# Patient Record
Sex: Female | Born: 1955 | Race: White | Hispanic: No | Smoking: Current every day smoker
Health system: Southern US, Community
[De-identification: ages and names within clinical notes are randomized; demographics above are authoritative.]

## PROBLEM LIST (undated history)

## (undated) DIAGNOSIS — E559 Vitamin D deficiency, unspecified: Secondary | ICD-10-CM

## (undated) DIAGNOSIS — T7840XA Allergy, unspecified, initial encounter: Secondary | ICD-10-CM

## (undated) DIAGNOSIS — M722 Plantar fascial fibromatosis: Secondary | ICD-10-CM

## (undated) DIAGNOSIS — D219 Benign neoplasm of connective and other soft tissue, unspecified: Secondary | ICD-10-CM

## (undated) DIAGNOSIS — E785 Hyperlipidemia, unspecified: Principal | ICD-10-CM

## (undated) DIAGNOSIS — IMO0002 Reserved for concepts with insufficient information to code with codable children: Secondary | ICD-10-CM

## (undated) HISTORY — DX: Allergy, unspecified, initial encounter: T78.40XA

## (undated) HISTORY — DX: Benign neoplasm of connective and other soft tissue, unspecified: D21.9

## (undated) HISTORY — PX: SPINE SURGERY: SHX786

## (undated) HISTORY — PX: FOOT SURGERY: SHX648

## (undated) HISTORY — PX: TUBAL LIGATION: SHX77

## (undated) HISTORY — DX: Hyperlipidemia, unspecified: E78.5

## (undated) HISTORY — PX: BACK SURGERY: SHX140

## (undated) HISTORY — DX: Plantar fascial fibromatosis: M72.2

## (undated) HISTORY — DX: Reserved for concepts with insufficient information to code with codable children: IMO0002

## (undated) HISTORY — PX: DILATION AND CURETTAGE OF UTERUS: SHX78

## (undated) HISTORY — DX: Vitamin D deficiency, unspecified: E55.9

## (undated) HISTORY — PX: NECK SURGERY: SHX720

---

## 2001-02-09 ENCOUNTER — Encounter: Payer: Self-pay | Admitting: Specialist

## 2001-02-09 ENCOUNTER — Ambulatory Visit (HOSPITAL_COMMUNITY): Admission: RE | Admit: 2001-02-09 | Discharge: 2001-02-09 | Payer: Self-pay | Admitting: Specialist

## 2001-05-17 ENCOUNTER — Other Ambulatory Visit: Admission: RE | Admit: 2001-05-17 | Discharge: 2001-05-17 | Payer: Self-pay | Admitting: Dermatology

## 2003-08-05 ENCOUNTER — Encounter (INDEPENDENT_AMBULATORY_CARE_PROVIDER_SITE_OTHER): Payer: Self-pay | Admitting: Internal Medicine

## 2005-06-06 HISTORY — PX: LEEP: SHX91

## 2006-03-13 ENCOUNTER — Ambulatory Visit (HOSPITAL_COMMUNITY): Admission: RE | Admit: 2006-03-13 | Discharge: 2006-03-13 | Payer: Self-pay | Admitting: Obstetrics and Gynecology

## 2006-12-13 ENCOUNTER — Telehealth (INDEPENDENT_AMBULATORY_CARE_PROVIDER_SITE_OTHER): Payer: Self-pay | Admitting: *Deleted

## 2006-12-13 ENCOUNTER — Ambulatory Visit (HOSPITAL_COMMUNITY): Admission: RE | Admit: 2006-12-13 | Discharge: 2006-12-13 | Payer: Self-pay | Admitting: Internal Medicine

## 2006-12-13 ENCOUNTER — Ambulatory Visit: Payer: Self-pay | Admitting: Internal Medicine

## 2006-12-13 DIAGNOSIS — M129 Arthropathy, unspecified: Secondary | ICD-10-CM | POA: Insufficient documentation

## 2006-12-13 DIAGNOSIS — E785 Hyperlipidemia, unspecified: Secondary | ICD-10-CM | POA: Insufficient documentation

## 2006-12-13 DIAGNOSIS — K279 Peptic ulcer, site unspecified, unspecified as acute or chronic, without hemorrhage or perforation: Secondary | ICD-10-CM | POA: Insufficient documentation

## 2006-12-13 DIAGNOSIS — M79609 Pain in unspecified limb: Secondary | ICD-10-CM | POA: Insufficient documentation

## 2006-12-13 DIAGNOSIS — H269 Unspecified cataract: Secondary | ICD-10-CM | POA: Insufficient documentation

## 2006-12-13 DIAGNOSIS — F411 Generalized anxiety disorder: Secondary | ICD-10-CM

## 2006-12-13 DIAGNOSIS — J309 Allergic rhinitis, unspecified: Secondary | ICD-10-CM | POA: Insufficient documentation

## 2006-12-13 DIAGNOSIS — M545 Low back pain, unspecified: Secondary | ICD-10-CM | POA: Insufficient documentation

## 2006-12-14 ENCOUNTER — Encounter (INDEPENDENT_AMBULATORY_CARE_PROVIDER_SITE_OTHER): Payer: Self-pay | Admitting: Internal Medicine

## 2006-12-15 ENCOUNTER — Telehealth (INDEPENDENT_AMBULATORY_CARE_PROVIDER_SITE_OTHER): Payer: Self-pay | Admitting: *Deleted

## 2006-12-15 ENCOUNTER — Encounter (INDEPENDENT_AMBULATORY_CARE_PROVIDER_SITE_OTHER): Payer: Self-pay | Admitting: *Deleted

## 2007-05-28 ENCOUNTER — Other Ambulatory Visit: Admission: RE | Admit: 2007-05-28 | Discharge: 2007-05-28 | Payer: Self-pay | Admitting: Obstetrics and Gynecology

## 2008-03-07 IMAGING — CR DG FOOT COMPLETE 3+V*R*
3 series · 3 of 3 positions shown · non-contrast
Comparison: none

CLINICAL DATA: Foot pain bilaterally. No injury.
 LEFT FOOT ? 3 VIEW:

[view not recorded (1 of 3)]
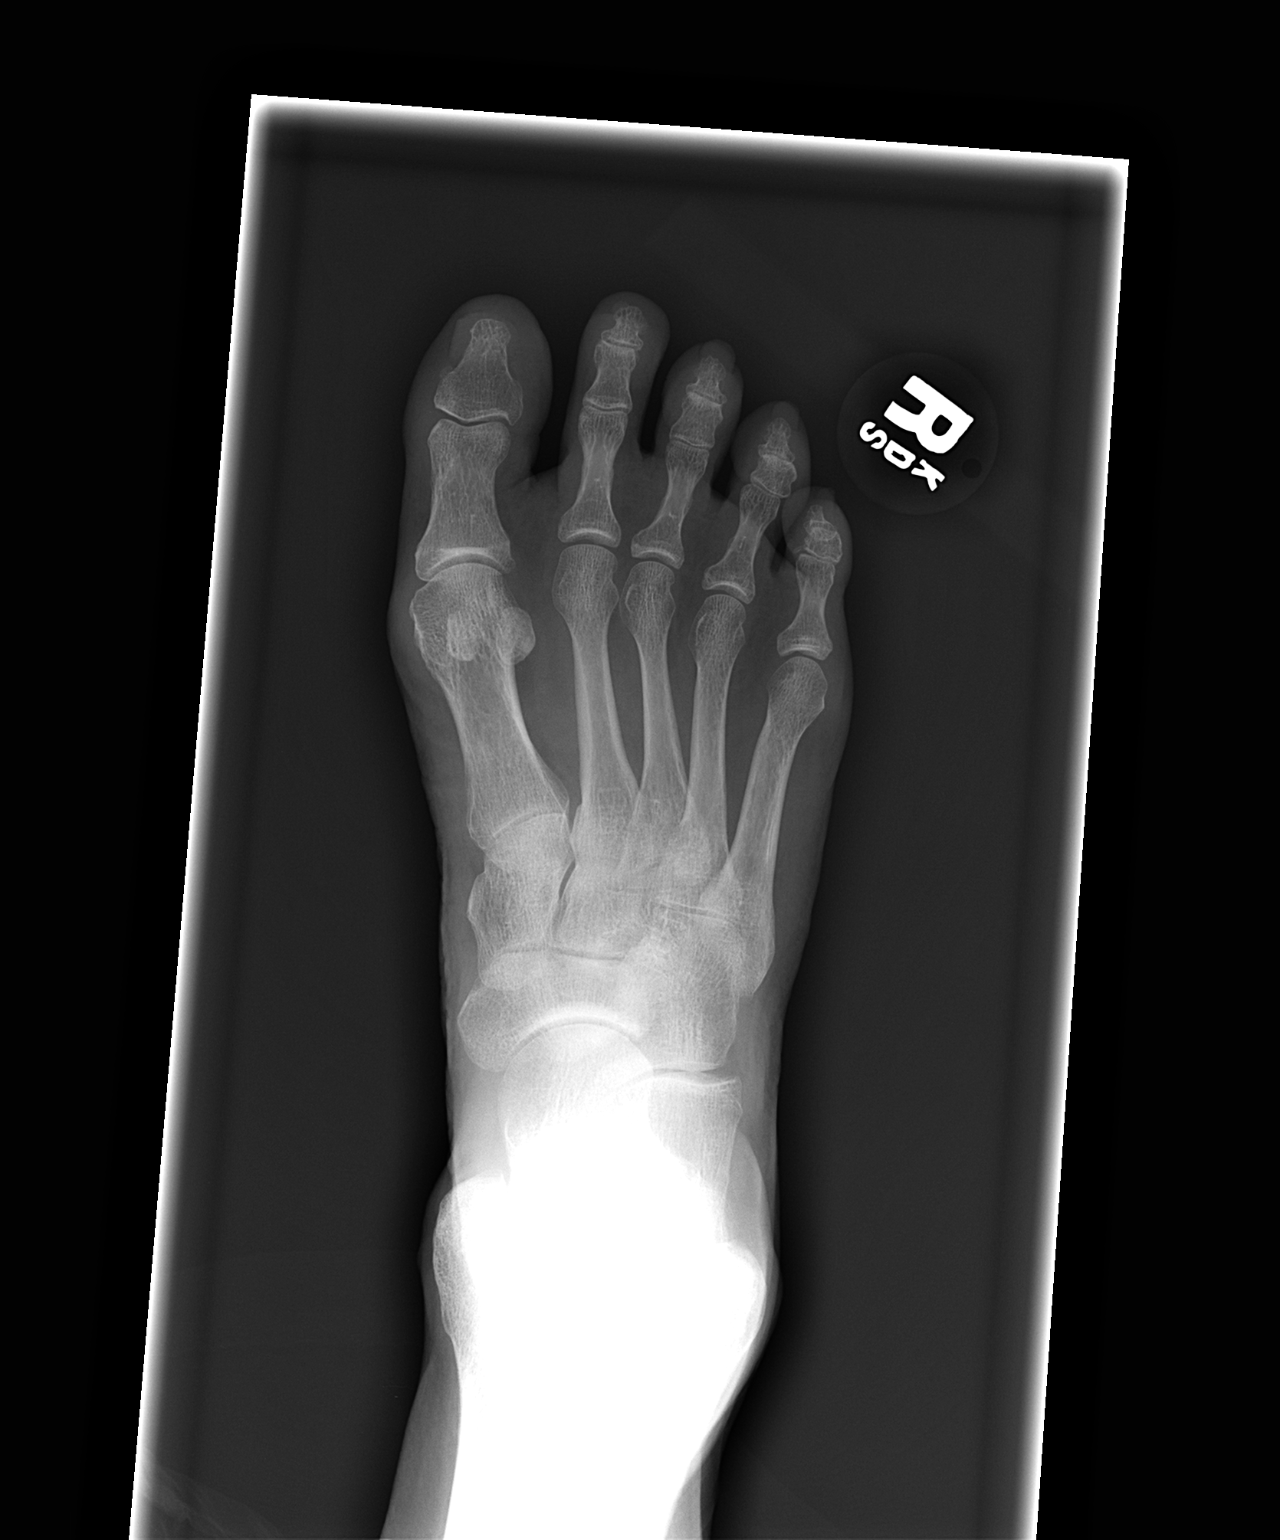

[view not recorded (2 of 3)]
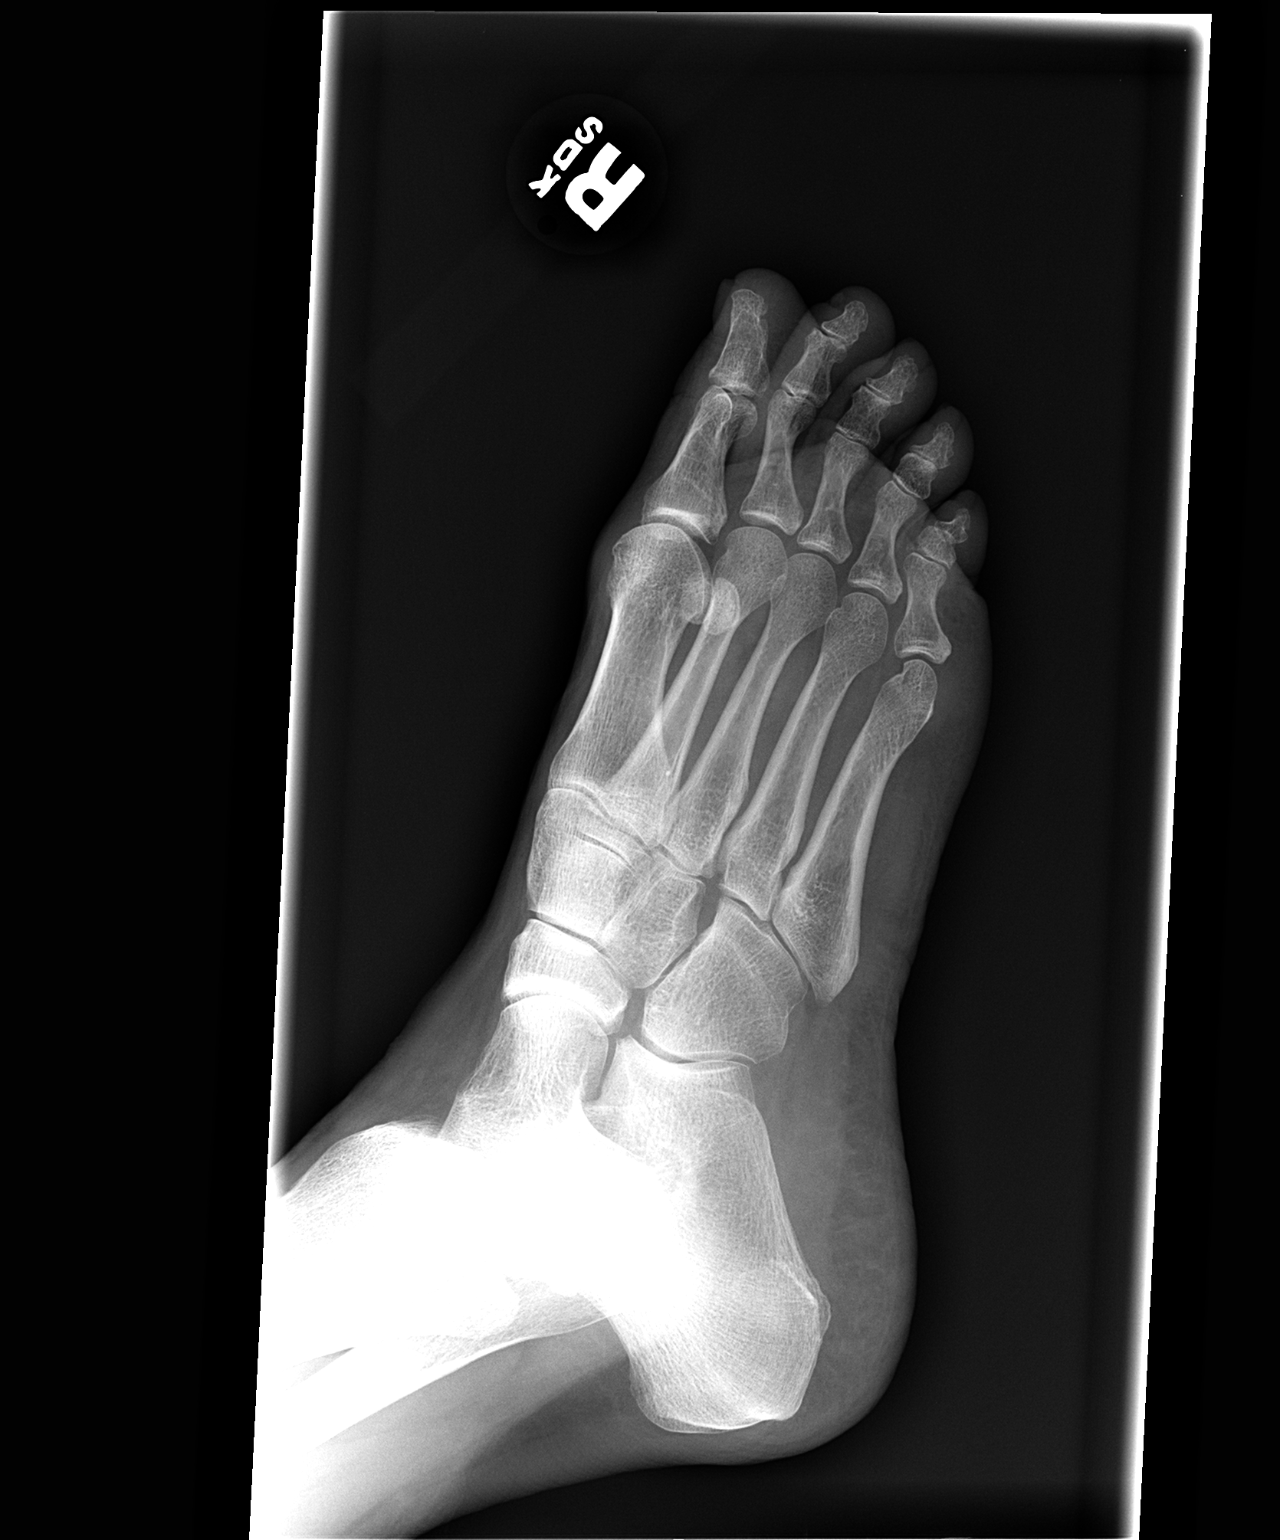

[view not recorded (3 of 3)]
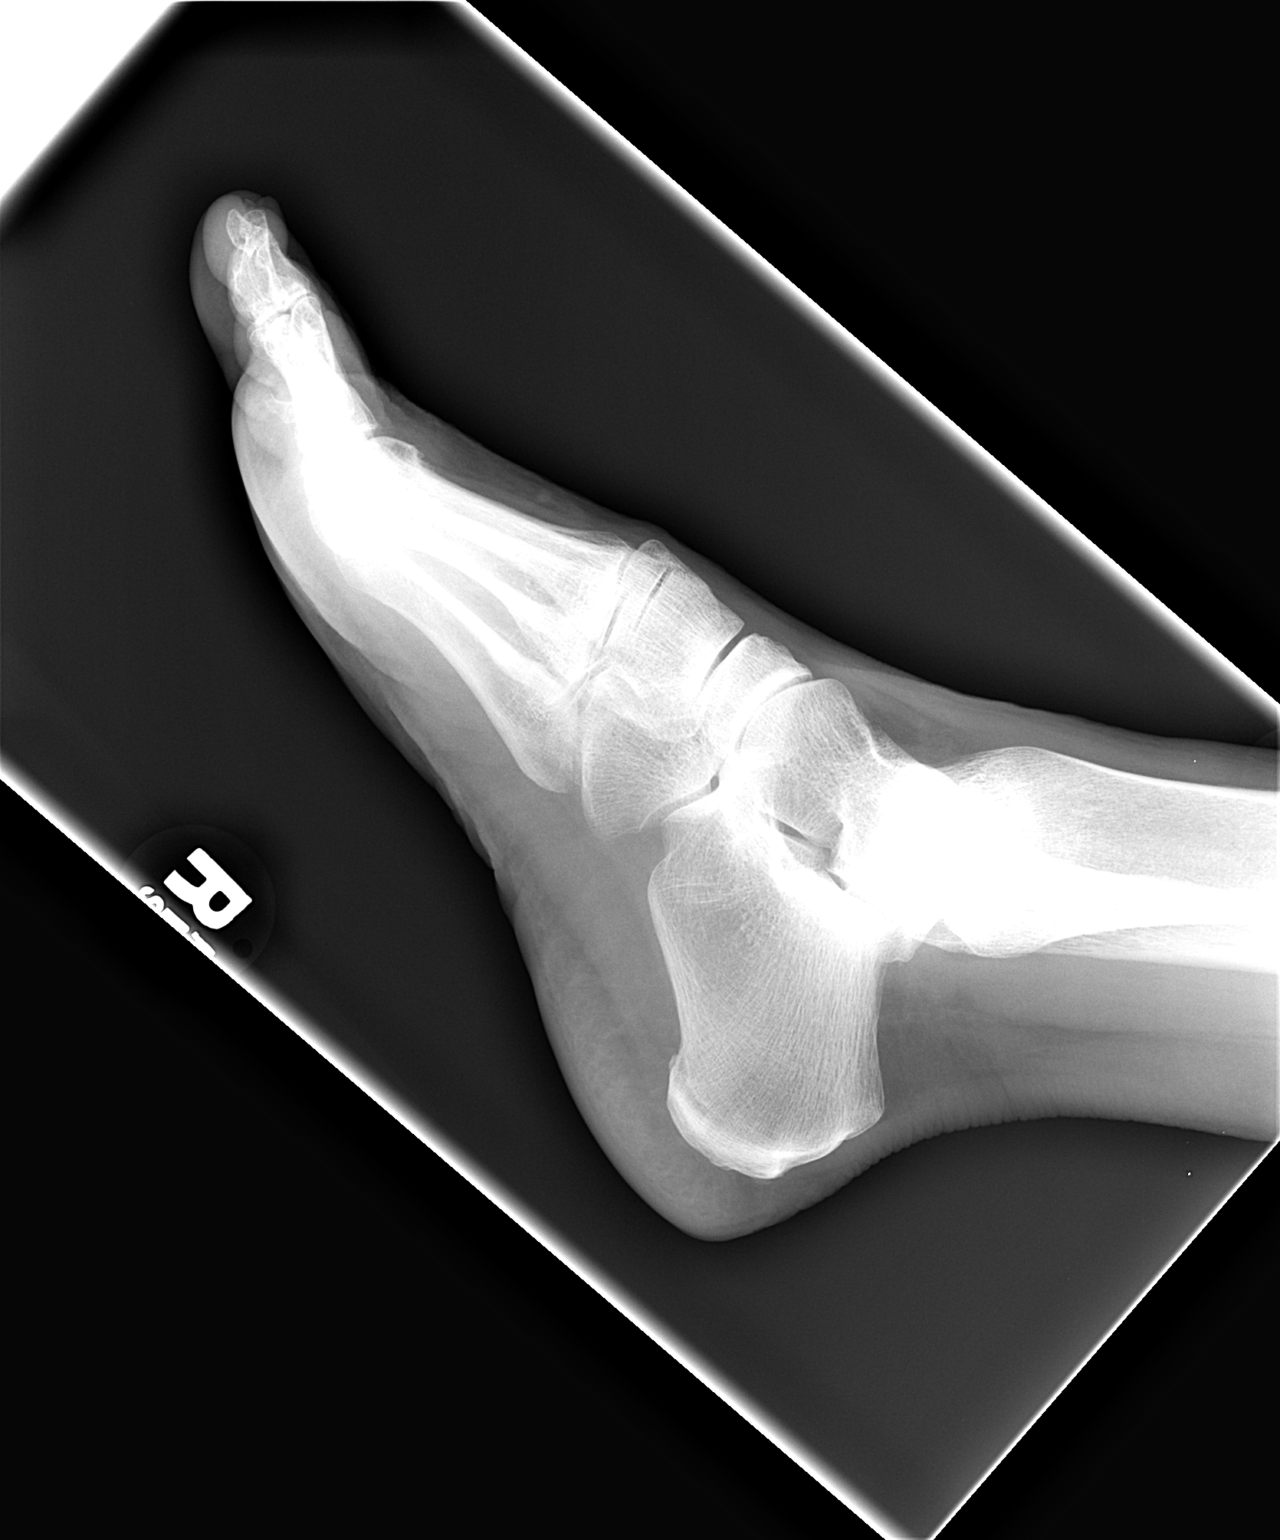

[3 of 3 positions shown; findings below may reference images not displayed]

FINDINGS: Three views of the left foot were obtained.  No acute bony abnormality is seen.  MTP, PIP and DIP joints appear normal with no erosions.  Alignment is normal.
IMPRESSION: Negative left foot.
 RIGHT FOOT ? 3 VIEW:
FINDINGS: Three views of the right foot show no acute abnormality.  Alignment is normal, and no erosions are seen.
IMPRESSION: Negative right foot.

## 2008-03-07 IMAGING — CR DG FOOT COMPLETE 3+V*L*
3 series · 3 of 3 positions shown · non-contrast
Comparison: none

CLINICAL DATA: Foot pain bilaterally. No injury.
 LEFT FOOT ? 3 VIEW:

[view not recorded (1 of 3)]
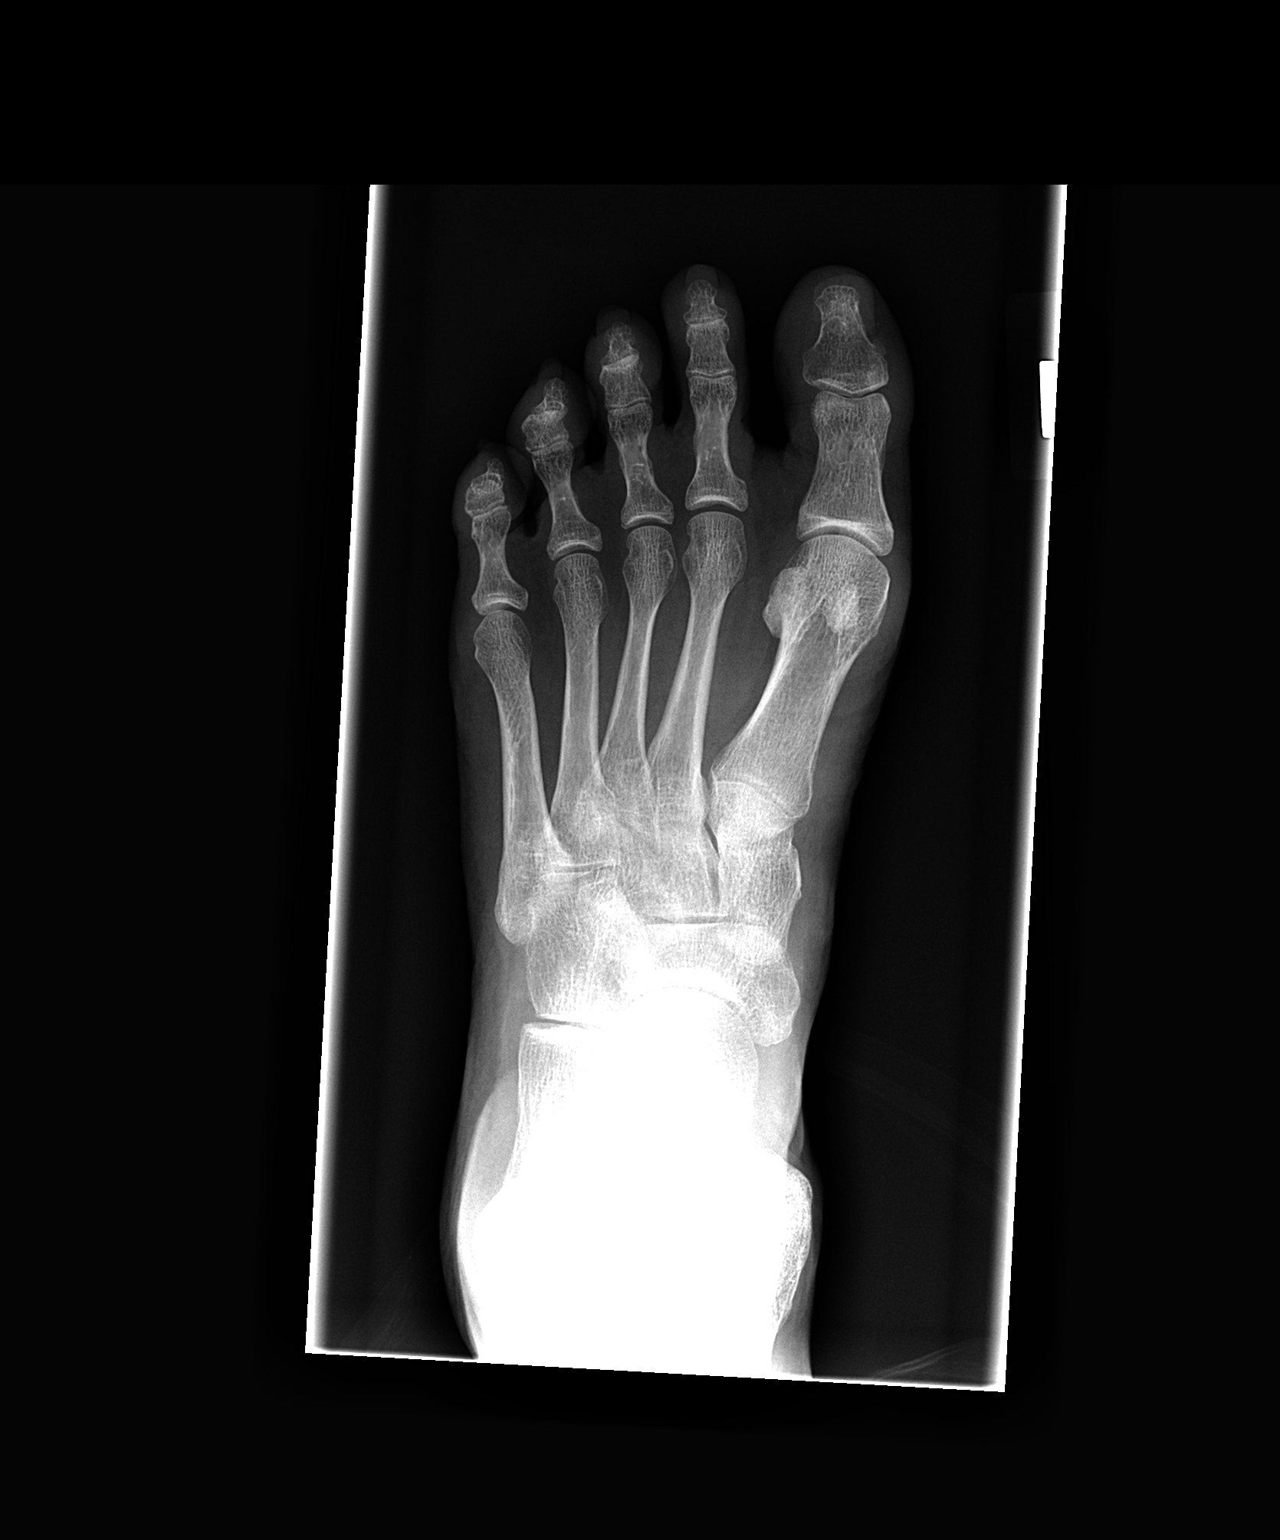

[view not recorded (2 of 3)]
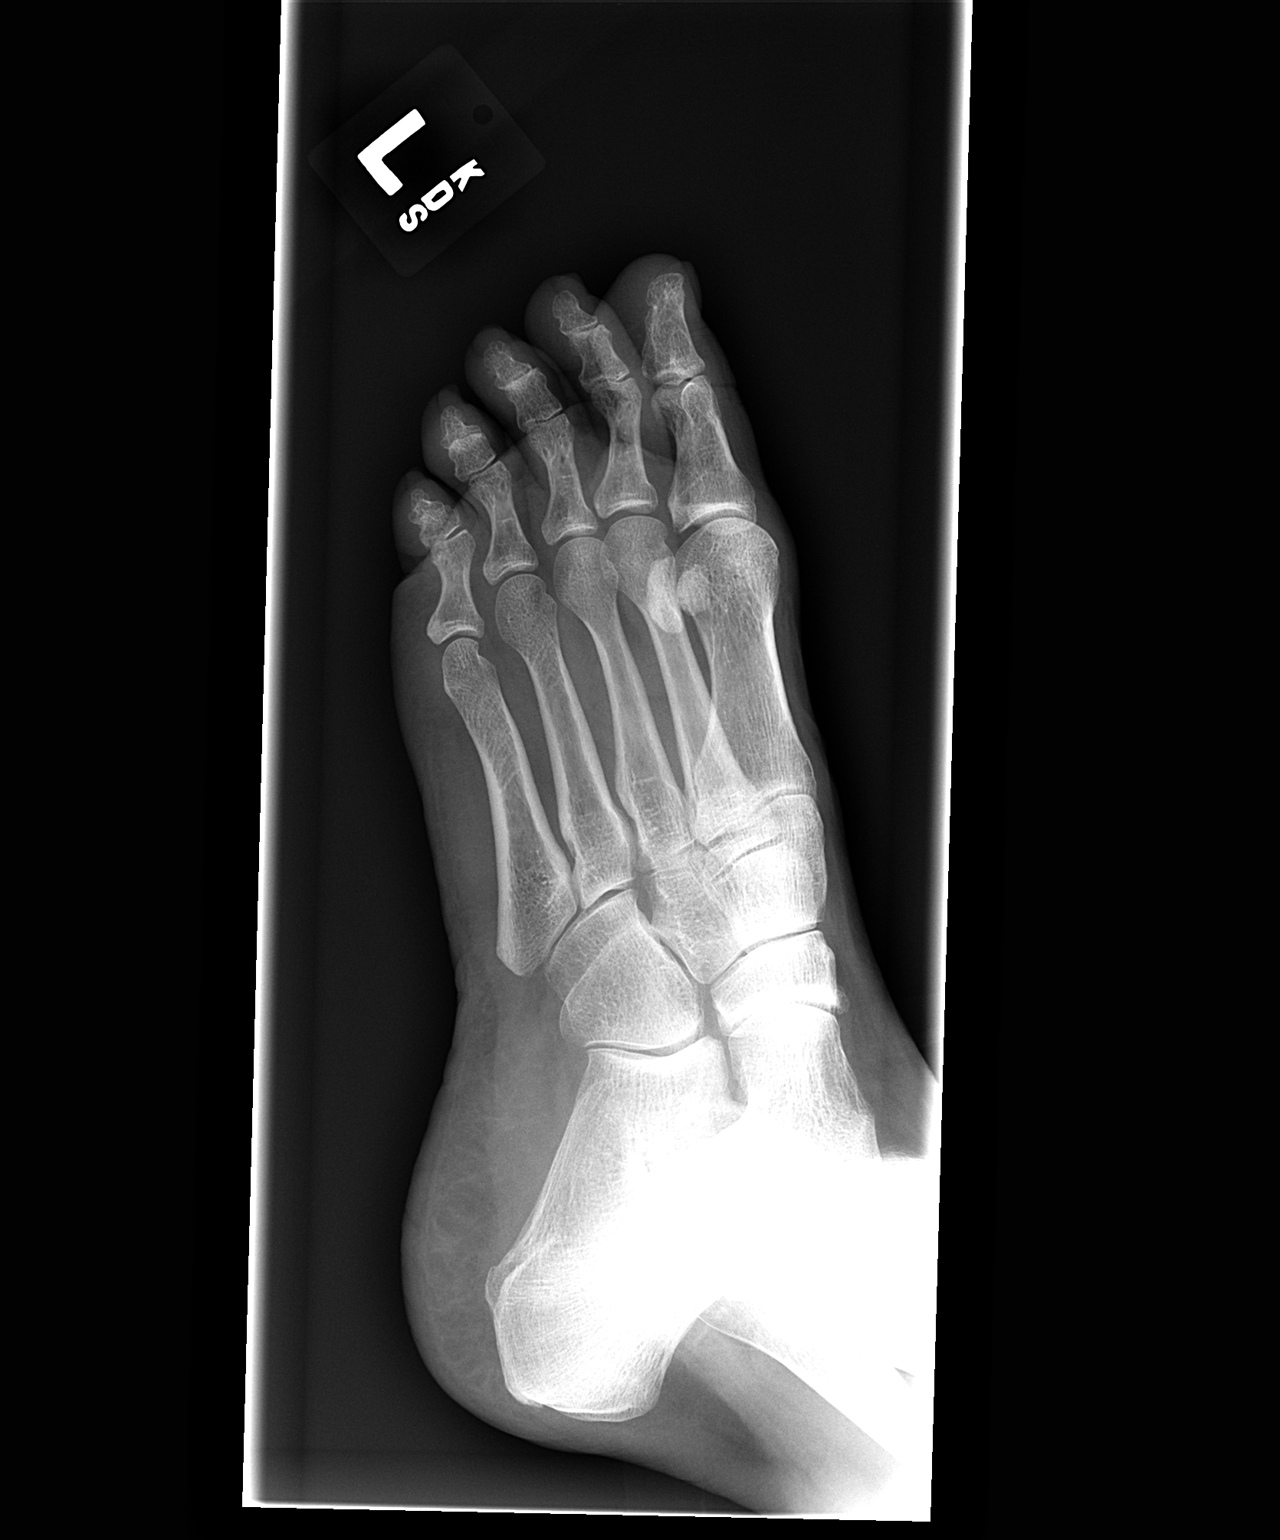

[view not recorded (3 of 3)]
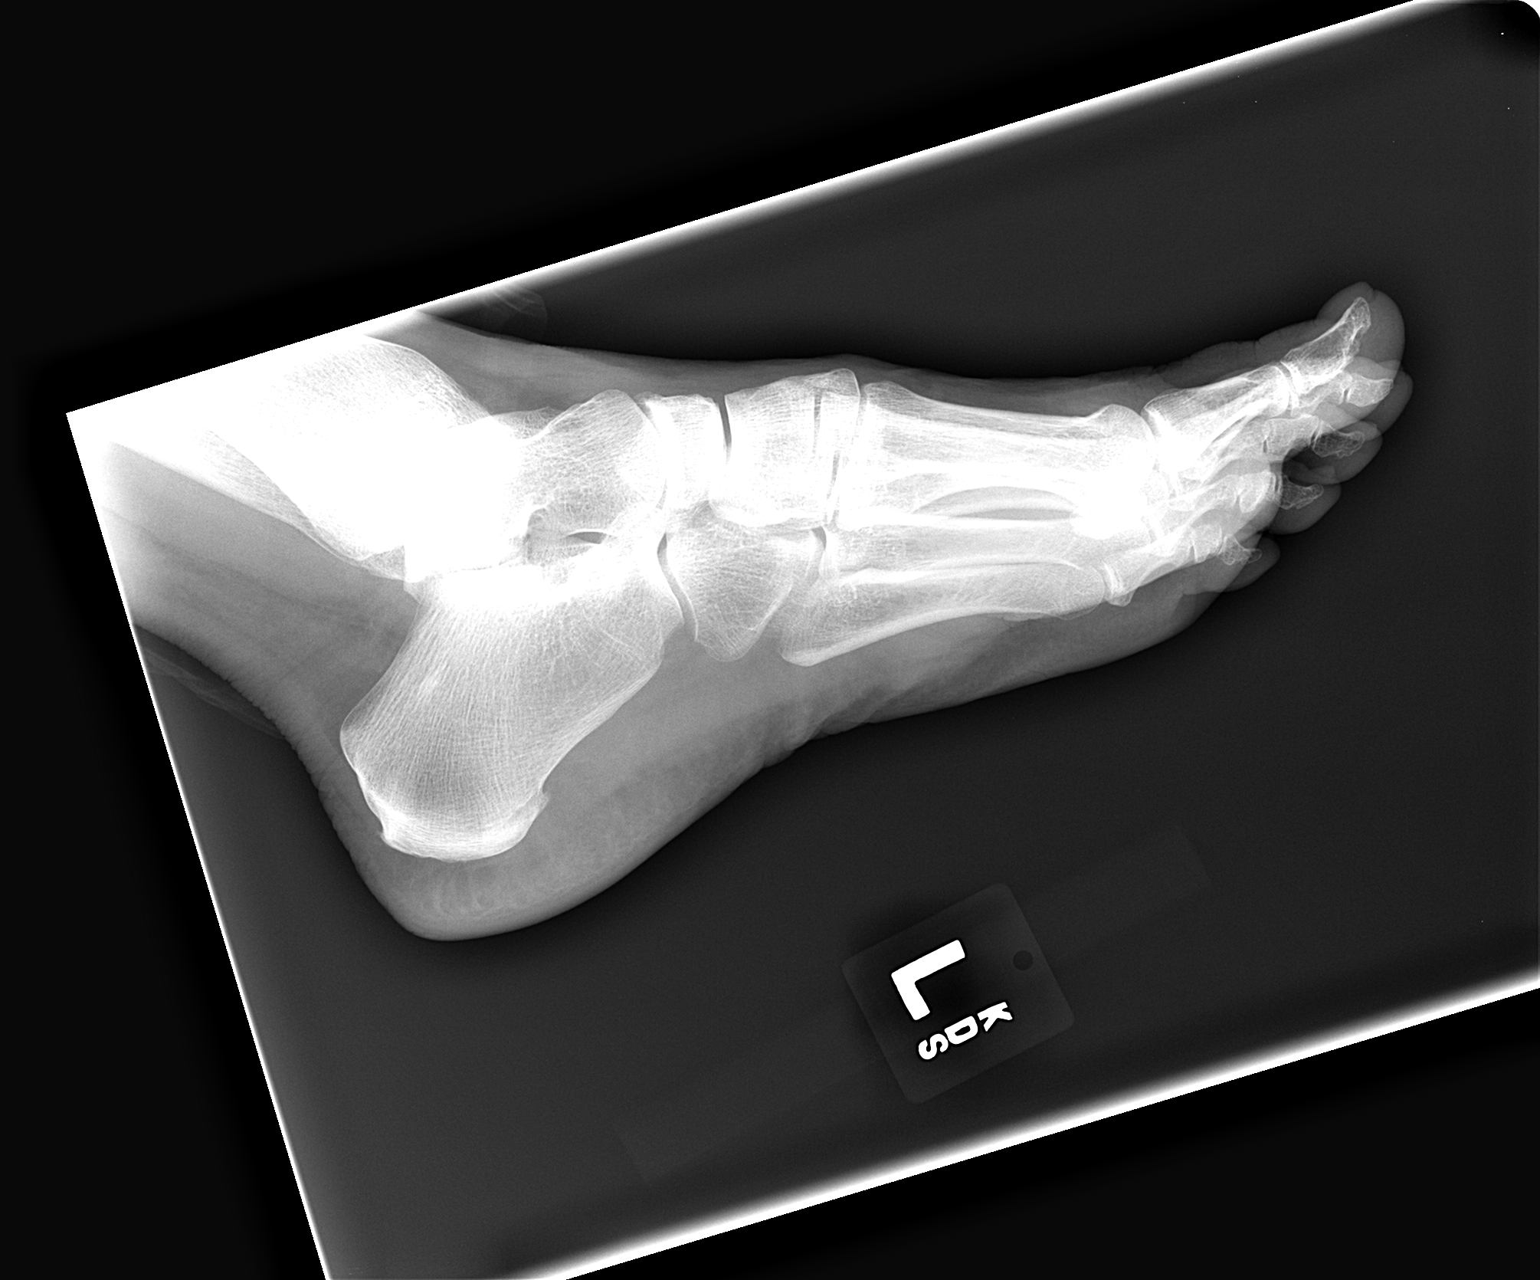

[3 of 3 positions shown; findings below may reference images not displayed]

FINDINGS: Three views of the left foot were obtained.  No acute bony abnormality is seen.  MTP, PIP and DIP joints appear normal with no erosions.  Alignment is normal.
IMPRESSION: Negative left foot.
 RIGHT FOOT ? 3 VIEW:
FINDINGS: Three views of the right foot show no acute abnormality.  Alignment is normal, and no erosions are seen.
IMPRESSION: Negative right foot.

## 2008-06-25 ENCOUNTER — Other Ambulatory Visit: Admission: RE | Admit: 2008-06-25 | Discharge: 2008-06-25 | Payer: Self-pay | Admitting: Obstetrics and Gynecology

## 2009-09-16 ENCOUNTER — Other Ambulatory Visit: Admission: RE | Admit: 2009-09-16 | Discharge: 2009-09-16 | Payer: Self-pay | Admitting: Obstetrics and Gynecology

## 2010-07-15 ENCOUNTER — Emergency Department (HOSPITAL_COMMUNITY)
Admission: EM | Admit: 2010-07-15 | Discharge: 2010-07-15 | Disposition: A | Discharge status: Home / Self Care | Payer: 59 | Attending: Emergency Medicine | Admitting: Emergency Medicine

## 2010-07-15 DIAGNOSIS — R197 Diarrhea, unspecified: Secondary | ICD-10-CM | POA: Insufficient documentation

## 2010-07-15 DIAGNOSIS — IMO0001 Reserved for inherently not codable concepts without codable children: Secondary | ICD-10-CM | POA: Insufficient documentation

## 2010-07-15 DIAGNOSIS — N289 Disorder of kidney and ureter, unspecified: Secondary | ICD-10-CM | POA: Insufficient documentation

## 2010-07-15 DIAGNOSIS — R1110 Vomiting, unspecified: Secondary | ICD-10-CM | POA: Insufficient documentation

## 2010-07-15 DIAGNOSIS — K5289 Other specified noninfective gastroenteritis and colitis: Secondary | ICD-10-CM | POA: Insufficient documentation

## 2010-07-15 DIAGNOSIS — M1990 Unspecified osteoarthritis, unspecified site: Secondary | ICD-10-CM | POA: Insufficient documentation

## 2010-07-15 LAB — BASIC METABOLIC PANEL
BUN: 11 mg/dL (ref 6–23)
CO2: 19 mEq/L (ref 19–32)
Calcium: 11.5 mg/dL — ABNORMAL HIGH (ref 8.4–10.5)
Chloride: 97 mEq/L (ref 96–112)
Creatinine, Ser: 1.89 mg/dL — ABNORMAL HIGH (ref 0.4–1.2)
GFR calc Af Amer: 33 mL/min — ABNORMAL LOW (ref >60)
GFR calc non Af Amer: 28 mL/min — ABNORMAL LOW (ref >60)
Glucose, Bld: 311 mg/dL — ABNORMAL HIGH (ref 70–99)
Potassium: 4.2 mEq/L (ref 3.5–5.1)
Sodium: 138 mEq/L (ref 135–145)

## 2010-07-15 LAB — CBC
Hemoglobin: 18.4 g/dL — ABNORMAL HIGH (ref 12.0–15.0)
MCH: 31 pg (ref 26.0–34.0)
MCHC: 35.1 g/dL (ref 30.0–36.0)
Platelets: 311 10*3/uL (ref 150–400)
RBC: 5.93 MIL/uL — ABNORMAL HIGH (ref 3.87–5.11)

## 2010-07-16 LAB — DIFFERENTIAL
Basophils Absolute: 0 10*3/uL (ref 0.0–0.1)
Eosinophils Absolute: 0 10*3/uL (ref 0.0–0.7)
Lymphocytes Relative: 4 % — ABNORMAL LOW (ref 12–46)
Monocytes Relative: 4 % (ref 3–12)
Neutro Abs: 17.2 10*3/uL — ABNORMAL HIGH (ref 1.7–7.7)
WBC Morphology: INCREASED

## 2010-11-02 ENCOUNTER — Other Ambulatory Visit: Payer: Self-pay | Admitting: Obstetrics and Gynecology

## 2010-11-02 DIAGNOSIS — Z139 Encounter for screening, unspecified: Secondary | ICD-10-CM

## 2010-11-03 ENCOUNTER — Other Ambulatory Visit (HOSPITAL_COMMUNITY)
Admission: RE | Admit: 2010-11-03 | Discharge: 2010-11-03 | Disposition: A | Discharge status: Home / Self Care | Payer: 59 | Source: Ambulatory Visit | Attending: Obstetrics and Gynecology | Admitting: Obstetrics and Gynecology

## 2010-11-03 DIAGNOSIS — Z01419 Encounter for gynecological examination (general) (routine) without abnormal findings: Secondary | ICD-10-CM | POA: Insufficient documentation

## 2010-11-15 ENCOUNTER — Ambulatory Visit (HOSPITAL_COMMUNITY)
Admission: RE | Admit: 2010-11-15 | Discharge: 2010-11-15 | Disposition: A | Discharge status: Home / Self Care | Payer: 59 | Source: Ambulatory Visit | Attending: Obstetrics and Gynecology | Admitting: Obstetrics and Gynecology

## 2010-11-15 DIAGNOSIS — Z139 Encounter for screening, unspecified: Secondary | ICD-10-CM

## 2010-11-15 DIAGNOSIS — Z1231 Encounter for screening mammogram for malignant neoplasm of breast: Secondary | ICD-10-CM | POA: Insufficient documentation

## 2011-06-22 ENCOUNTER — Other Ambulatory Visit (HOSPITAL_COMMUNITY): Payer: Self-pay | Admitting: Rheumatology

## 2011-06-22 DIAGNOSIS — Z780 Asymptomatic menopausal state: Secondary | ICD-10-CM

## 2011-06-28 ENCOUNTER — Other Ambulatory Visit (HOSPITAL_COMMUNITY): Payer: 59

## 2011-08-12 ENCOUNTER — Inpatient Hospital Stay (HOSPITAL_COMMUNITY): Admission: RE | Admit: 2011-08-12 | Payer: 59 | Source: Ambulatory Visit

## 2012-08-15 ENCOUNTER — Other Ambulatory Visit: Payer: Self-pay | Admitting: Obstetrics and Gynecology

## 2012-08-15 ENCOUNTER — Other Ambulatory Visit (HOSPITAL_COMMUNITY)
Admission: RE | Admit: 2012-08-15 | Discharge: 2012-08-15 | Disposition: A | Discharge status: Home / Self Care | Payer: 59 | Source: Ambulatory Visit | Attending: Obstetrics and Gynecology | Admitting: Obstetrics and Gynecology

## 2012-08-15 DIAGNOSIS — Z01419 Encounter for gynecological examination (general) (routine) without abnormal findings: Secondary | ICD-10-CM | POA: Insufficient documentation

## 2012-08-15 DIAGNOSIS — Z1151 Encounter for screening for human papillomavirus (HPV): Secondary | ICD-10-CM | POA: Insufficient documentation

## 2013-08-20 ENCOUNTER — Other Ambulatory Visit: Payer: Self-pay | Admitting: Obstetrics and Gynecology

## 2013-08-21 ENCOUNTER — Encounter: Payer: Self-pay | Admitting: Obstetrics and Gynecology

## 2013-08-21 ENCOUNTER — Other Ambulatory Visit (HOSPITAL_COMMUNITY)
Admission: RE | Admit: 2013-08-21 | Discharge: 2013-08-21 | Disposition: A | Discharge status: Home / Self Care | Payer: 59 | Source: Ambulatory Visit | Attending: Obstetrics and Gynecology | Admitting: Obstetrics and Gynecology

## 2013-08-21 ENCOUNTER — Ambulatory Visit (INDEPENDENT_AMBULATORY_CARE_PROVIDER_SITE_OTHER): Payer: 59 | Admitting: Obstetrics and Gynecology

## 2013-08-21 ENCOUNTER — Encounter (INDEPENDENT_AMBULATORY_CARE_PROVIDER_SITE_OTHER): Payer: Self-pay

## 2013-08-21 VITALS — BP 140/84 | Ht 64.0 in | Wt 149.0 lb

## 2013-08-21 DIAGNOSIS — Z01419 Encounter for gynecological examination (general) (routine) without abnormal findings: Secondary | ICD-10-CM | POA: Insufficient documentation

## 2013-08-21 DIAGNOSIS — Z0000 Encounter for general adult medical examination without abnormal findings: Secondary | ICD-10-CM

## 2013-08-21 DIAGNOSIS — Z1151 Encounter for screening for human papillomavirus (HPV): Secondary | ICD-10-CM | POA: Insufficient documentation

## 2013-08-21 DIAGNOSIS — Z1212 Encounter for screening for malignant neoplasm of rectum: Secondary | ICD-10-CM

## 2013-08-21 MED ORDER — ESTRADIOL-NORETHINDRONE ACET 1-0.5 MG PO TABS: 1.0000 | ORAL_TABLET | Freq: Every day | ORAL | Status: DC

## 2013-08-21 MED ORDER — DIAZEPAM 10 MG PO TABS: 10.0000 mg | ORAL_TABLET | ORAL | Status: DC | PRN

## 2013-08-21 NOTE — Patient Instructions (Addendum)
Please get your annual mammograms. PAP results will be back in 2 weeks. If abnormal, we will call sooner. If you have not gotten any notifications, please call the office after 2 weeks.  Please sign up for "My CHART"

## 2013-08-21 NOTE — Progress Notes (Signed)
This chart was scribed by Jenne Campus, Medical Scribe, for Dr. [name] on 08/21/13 at 2:30 PM. This chart was reviewed by Dr. [name] and is accurate.  Assessment:  Annual Gyn Exam   Plan:  1. pap smear done, next pap due 1 year 2. return annually or prn 3    Annual mammogram advised Renew activella, valium  Subjective:  Kristi Boyd is a 58 y.o. female who presents for annual exam. No LMP recorded. Patient is postmenopausal. The patient has no complaints today.   Low grade in 2007, LEEP in 2007, normal PAPs since then.  The following portions of the patient's history were reviewed and updated as appropriate: allergies, current medications, past family history, past medical history, past social history, past surgical history and problem list.  Review of Systems Constitutional: negative Gastrointestinal: negative Genitourinary: negative  Objective:  BP 140/84  Ht 5\' 4"  (1.626 m)  Wt 149 lb (67.586 kg)  BMI 25.56 kg/m2   BMI: Body mass index is 25.56 kg/(m^2).  Chaperone present for exam which was performed with pt's permission. General Appearance: Alert, appropriate appearance for age. No acute distress HEENT: Grossly normal Neck / Thyroid:  Cardiovascular: RRR; normal S1, S2, no murmur Lungs: CTA bilaterally Back: No CVAT Breast Exam: No masses or nodes. No dimpling, nipple retraction or discharge. Gastrointestinal: Soft, non-tender, no masses or organomegaly Pelvic Exam: External genitalia: normal general appearance Vaginal: normal without tenderness, induration or masses and atrophic mucosa Cervix: thin prep PAP obtained and small amount of scar tissue consistent with prior LEEP Adnexa: normal bimanual exam Uterus: normal single, nontender Rectal: small area of weak support noted Lymphatic Exam: Non-palpable nodes in neck, clavicular, axillary, or inguinal regions  Skin: no rash or abnormalities Neurologic: Normal gait and speech, no tremor   Psychiatric: Alert and oriented, appropriate affect.  Urinalysis:Not done  [name]. MD Pgr [phone] 2:41 PM

## 2013-08-26 LAB — HEMOCCULT GUIAC POC 1CARD (OFFICE): Fecal Occult Blood, POC: NEGATIVE

## 2014-02-12 ENCOUNTER — Other Ambulatory Visit: Payer: Self-pay | Admitting: Obstetrics and Gynecology

## 2014-08-21 ENCOUNTER — Ambulatory Visit (INDEPENDENT_AMBULATORY_CARE_PROVIDER_SITE_OTHER): Payer: 59 | Admitting: Obstetrics and Gynecology

## 2014-08-21 ENCOUNTER — Encounter: Payer: Self-pay | Admitting: Obstetrics and Gynecology

## 2014-08-21 VITALS — BP 130/70 | Ht 64.0 in | Wt 150.0 lb

## 2014-08-21 DIAGNOSIS — Z01419 Encounter for gynecological examination (general) (routine) without abnormal findings: Secondary | ICD-10-CM | POA: Diagnosis not present

## 2014-08-21 DIAGNOSIS — Z0000 Encounter for general adult medical examination without abnormal findings: Secondary | ICD-10-CM

## 2014-08-21 DIAGNOSIS — F419 Anxiety disorder, unspecified: Secondary | ICD-10-CM

## 2014-08-21 MED ORDER — ESTRADIOL-NORETHINDRONE ACET 1-0.5 MG PO TABS: 1.0000 | ORAL_TABLET | Freq: Every day | ORAL | Status: DC

## 2014-08-21 MED ORDER — DIAZEPAM 10 MG PO TABS: ORAL_TABLET | ORAL | Status: DC

## 2014-08-21 NOTE — Progress Notes (Signed)
Patient ID: Kristi Boyd, female   DOB: 1955/06/29, 59 y.o.   MRN: [id number] Pt here today for annual exam. Pt denies any problems or concerns at this time.

## 2014-08-21 NOTE — Progress Notes (Signed)
Patient ID: Kristi Boyd, female   DOB: 02-18-1956, 59 y.o.   MRN: [id number]  This chart was scribed for Kristi Kind, MD by Donato Schultz, ED Scribe. This patient was seen in Room 1 and the patient's care was started at 4:17 PM.   Assessment:  Annual Gyn Exam   Plan:  1. Next pap due 08/2016 2. Return annually or prn 3    Annual mammogram advised refil Activella and valium Subjective:  Kristi Boyd is a 59 y.o. female No obstetric history on file. who presents for annual exam. No LMP recorded. Patient is postmenopausal. The patient has no complaints today.  Her last pap was on 08/21/2013 and was normal and negative for HPV.  The following portions of the patient's history were reviewed and updated as appropriate: allergies, current medications, past family history, past medical history, past social history, past surgical history and problem list. Past Medical History  Diagnosis Date  . Ulcer   . Allergy   . Plantar fasciitis   . Disc degeneration     Past Surgical History  Procedure Laterality Date  . Back surgery    . Foot surgery    . Dilation and curettage of uterus    . Tubal ligation    . Spine surgery    . Neck surgery    . Leep  2007     Current outpatient prescriptions:  .  cetirizine (ZYRTEC) 10 MG tablet, Take 10 mg by mouth daily., Disp: , Rfl:  .  diphenhydrAMINE (BENADRYL) 25 MG tablet, Take 25 mg by mouth every 6 (six) hours as needed., Disp: , Rfl:  .  estradiol-norethindrone (ACTIVELLA) 1-0.5 MG per tablet, Take 1 tablet by mouth daily., Disp: 30 tablet, Rfl: 11 .  ranitidine (ZANTAC) 300 MG tablet, Take 300 mg by mouth at bedtime., Disp: , Rfl:  .  VALIUM 10 MG tablet, TAKE 1 TABLET DAILY AS NEEDED FOR STRESS., Disp: 30 tablet, Rfl: 1  Review of Systems Constitutional: negative Gastrointestinal: negative Genitourinary: negative  Objective:  BP 130/70 mmHg  Ht 5\' 4"  (1.626 m)  Wt 150 lb (68.04 kg)  BMI 25.73 kg/m2   BMI: Body mass index is  25.73 kg/(m^2).  General Appearance: Alert, appropriate appearance for age. No acute distress HEENT: Grossly normal Neck / Thyroid:  Cardiovascular: RRR; normal S1, S2, no murmur Lungs: CTA bilaterally Back: No CVAT Breast Exam: No dimpling, nipple retraction or discharge. No masses or nodes. Gastrointestinal: Soft, non-tender, no masses or organomegaly Pelvic Exam: External genitalia: normal general appearance Vaginal: normal mucosa without prolapse or lesions and normal without tenderness, induration or masses, post-menupausal tissues, short vaginal length Cervix: small Adnexa: normal bimanual exam Uterus: normal single, nontender Rectovaginal: normal rectal, no masses Lymphatic Exam: Non-palpable nodes in neck, clavicular, axillary, or inguinal regions Skin: no rash or abnormalities Neurologic: Normal gait and speech, no tremor  Psychiatric: Alert and oriented, appropriate affect.  Urinalysis:Not done Hemoccult: Negative  [name]. MD Pgr [phone] 4:17 PM

## 2014-08-25 ENCOUNTER — Other Ambulatory Visit: Payer: Self-pay | Admitting: Obstetrics and Gynecology

## 2014-12-12 ENCOUNTER — Other Ambulatory Visit: Payer: Self-pay | Admitting: Obstetrics and Gynecology

## 2014-12-14 NOTE — Telephone Encounter (Signed)
Will ask pt to make appt to discuss.

## 2014-12-16 ENCOUNTER — Other Ambulatory Visit: Payer: Self-pay | Admitting: Obstetrics and Gynecology

## 2014-12-16 NOTE — Telephone Encounter (Signed)
Pt states [facility] did not have her RX for Valium. Informed pt it appeared that Dr. Glo Herring did the RX per Epic but RX printed and did not go electronically. Eulas Post from [facility] states they do not have the RX, gave verbal order for   Valium 10 mg take one tablet daily as needed for stress, #30, no refills  Verbal order given to Higinio Plan Pharmacist per order in [facility] from 12/14/2014. Pt aware.

## 2015-02-05 ENCOUNTER — Ambulatory Visit (INDEPENDENT_AMBULATORY_CARE_PROVIDER_SITE_OTHER): Payer: 59 | Admitting: Obstetrics and Gynecology

## 2015-02-05 ENCOUNTER — Encounter: Payer: Self-pay | Admitting: Obstetrics and Gynecology

## 2015-02-05 VITALS — BP 124/80 | Ht 63.0 in | Wt 148.5 lb

## 2015-02-05 DIAGNOSIS — F410 Panic disorder [episodic paroxysmal anxiety] without agoraphobia: Secondary | ICD-10-CM | POA: Insufficient documentation

## 2015-02-05 MED ORDER — DIAZEPAM 10 MG PO TABS: ORAL_TABLET | ORAL | Status: DC

## 2015-02-05 NOTE — Progress Notes (Signed)
Patient ID: Kristi Boyd, female   DOB: 1955-07-17, 59 y.o.   MRN: [id number]   [facility] Visit  Patient name: Kristi Boyd MRN [id number]  Date of birth: 19-Feb-1956  CC & HPI:  Kristi Boyd is a 59 y.o. female presenting today for review of meds. Pt is basically fine, on HT x yrs. Pt cannot do without HT due to vasomotor sx even if she misses a single pill. Pt did NOT tolerate reduction of dosing of Activella when last tried to reduce. It. Has  Valium concerns. Using valium that is not as effective as before. [employer].  Pt can tell difference in generic versus Rx branded Valuim being used at 5 mg tab once daily , sometimes bid. Pt notes  Better sense of wellbeing on Valium.pt did not do well when placed on Lyrica. Had side effects of joints tho she was calmer.   ROS:  No wt gain, or loss. + less sleep.  Minor greiving due to loss of dog.  Not sexually active. Employed, to work one more year. NO mammogram in 4 yrs.  Pertinent History Reviewed:   Reviewed: Significant for allergies,  Medical         Past Medical History  Diagnosis Date  . Ulcer   . Allergy   . Plantar fasciitis   . Disc degeneration                               Surgical Hx:    Past Surgical History  Procedure Laterality Date  . Back surgery    . Foot surgery    . Dilation and curettage of uterus    . Tubal ligation    . Spine surgery    . Neck surgery    . Leep  2007   Medications: Reviewed & Updated - see associated section                       Current outpatient prescriptions:  .  cetirizine (ZYRTEC) 10 MG tablet, Take 10 mg by mouth daily., Disp: , Rfl:  .  diphenhydrAMINE (BENADRYL) 25 MG tablet, Take 25 mg by mouth every 6 (six) hours as needed., Disp: , Rfl:  .  estradiol-norethindrone (ACTIVELLA) 1-0.5 MG per tablet, Take 1 tablet by mouth daily., Disp: 30 tablet, Rfl: 11 .  fluticasone (FLONASE) 50 MCG/ACT nasal spray, Place 1 spray into both nostrils daily., Disp: ,  Rfl:  .  ranitidine (ZANTAC) 300 MG tablet, Take 300 mg by mouth at bedtime., Disp: , Rfl:    Social History: Reviewed -  reports that she has been smoking Cigarettes.  She has a 16 pack-year smoking history. She has never used smokeless tobacco.  Objective Findings:  Vitals: Blood pressure 124/80, height 5\' 3"  (1.6 m), weight 148 lb 8 oz (67.359 kg).  Physical Examination: General appearance - alert, well appearing, and in no distress, normal appearing weight, acyanotic, in no respiratory distress, improved and well hydrated Mental status - alert, oriented to person, place, and time, normal mood, behavior, speech, dress, motor activity, and thought processes Neck - supple, no significant adenopathy   Assessment & Plan:   A:  1. Postmenopausal on HT long term 2. Needs mammogram pt to call and arrange. 3  P:  1. mammo 2. Continue HT 3. Continue valiium thru [place].Pharmacy 4. Pap in 2118 , 2 yr 5,colonoscopy needed..... rehman  to see.

## 2015-07-22 ENCOUNTER — Other Ambulatory Visit: Payer: Self-pay | Admitting: Obstetrics and Gynecology

## 2015-07-22 DIAGNOSIS — Z1231 Encounter for screening mammogram for malignant neoplasm of breast: Secondary | ICD-10-CM

## 2015-07-27 ENCOUNTER — Ambulatory Visit (HOSPITAL_COMMUNITY): Payer: Self-pay

## 2015-07-28 ENCOUNTER — Other Ambulatory Visit: Payer: Self-pay | Admitting: Obstetrics and Gynecology

## 2015-07-28 MED ORDER — ESTRADIOL-NORETHINDRONE ACET 1-0.5 MG PO TABS: 1.0000 | ORAL_TABLET | Freq: Every day | ORAL | Status: DC

## 2015-08-03 ENCOUNTER — Ambulatory Visit (HOSPITAL_COMMUNITY)
Admission: RE | Admit: 2015-08-03 | Discharge: 2015-08-03 | Disposition: A | Discharge status: Home / Self Care | Payer: 59 | Source: Ambulatory Visit | Attending: Obstetrics and Gynecology | Admitting: Obstetrics and Gynecology

## 2015-08-03 DIAGNOSIS — Z1231 Encounter for screening mammogram for malignant neoplasm of breast: Secondary | ICD-10-CM | POA: Insufficient documentation

## 2015-08-26 ENCOUNTER — Ambulatory Visit (INDEPENDENT_AMBULATORY_CARE_PROVIDER_SITE_OTHER): Payer: 59 | Admitting: Obstetrics and Gynecology

## 2015-08-26 ENCOUNTER — Encounter: Payer: Self-pay | Admitting: Obstetrics and Gynecology

## 2015-08-26 ENCOUNTER — Other Ambulatory Visit (HOSPITAL_COMMUNITY)
Admission: RE | Admit: 2015-08-26 | Discharge: 2015-08-26 | Disposition: A | Discharge status: Home / Self Care | Payer: 59 | Source: Ambulatory Visit | Attending: Obstetrics and Gynecology | Admitting: Obstetrics and Gynecology

## 2015-08-26 VITALS — BP 148/80 | Ht 63.0 in | Wt 159.0 lb

## 2015-08-26 DIAGNOSIS — Z01419 Encounter for gynecological examination (general) (routine) without abnormal findings: Secondary | ICD-10-CM

## 2015-08-26 DIAGNOSIS — Z1329 Encounter for screening for other suspected endocrine disorder: Secondary | ICD-10-CM

## 2015-08-26 DIAGNOSIS — Z0189 Encounter for other specified special examinations: Secondary | ICD-10-CM

## 2015-08-26 DIAGNOSIS — Z1151 Encounter for screening for human papillomavirus (HPV): Secondary | ICD-10-CM | POA: Insufficient documentation

## 2015-08-26 DIAGNOSIS — E559 Vitamin D deficiency, unspecified: Secondary | ICD-10-CM

## 2015-08-26 DIAGNOSIS — Z13220 Encounter for screening for lipoid disorders: Secondary | ICD-10-CM

## 2015-08-26 NOTE — Progress Notes (Signed)
Patient ID: Kristi Boyd, female   DOB: 12/31/1955, 60 y.o.   MRN: [id number] Pt here today for annual exam. Pt states that she needs to discuss hormone medication with Dr. Glo Herring.

## 2015-08-26 NOTE — Progress Notes (Signed)
Patient ID: Kristi Boyd, female   DOB: 04/30/1956, 60 y.o.   MRN: [id number]  Assessment:  Annual Gyn Exam 1. Normal post menopausal visit  2. Chronic valium use 3. Postmenopausal vasomotor Sx, severe Plan:  1. pap smear done, next pap due in one year.  2. return annually or prn 3    Annual mammogram advised 4.   Renew valium  5.   Estrogen Rx called in to pharmacy  Subjective:  Kristi Boyd is a 60 y.o. female No obstetric history on file. who presents for annual exam. No LMP recorded. Patient is postmenopausal. The patient has complaints today of regarding her medication. Pt reports she takers Activella for menopausal Sx including hot flashes. However, she ran out of said med this month and was prescribed the generic, Estradiol. Pt reports there is a noticeable difference between the generic; pt reports the generic version is not working and she prefers the brand name, Activella.   Pt also reports that she has a Hx of abnormal pap in 2007 and is supposed to receive yearly paps. She, however, did not get a pap last year.   Pt denies constipation or any Sx associated with bowel movements.  The following portions of the patient's history were reviewed and updated as appropriate: allergies, current medications, past family history, past medical history, past social history, past surgical history and problem list. Past Medical History  Diagnosis Date  . Ulcer   . Allergy   . Plantar fasciitis   . Disc degeneration   . Vitamin D deficiency     Past Surgical History  Procedure Laterality Date  . Back surgery    . Foot surgery    . Dilation and curettage of uterus    . Tubal ligation    . Spine surgery    . Neck surgery    . Leep  2007     Current outpatient prescriptions:  .  cetirizine (ZYRTEC) 10 MG tablet, Take 10 mg by mouth daily., Disp: , Rfl:  .  Cholecalciferol ([employer] VIT D 5000 HIGH-POTENCY) 5000 units capsule, Take 5,000 Units by mouth daily., Disp: , Rfl:  .   diazepam (VALIUM) 10 MG tablet, TAKE 1 TABLET BY MOUTH DAILY AS NEEDED FOR STRESS., Disp: 30 tablet, Rfl: 5 .  estradiol-norethindrone (ACTIVELLA) 1-0.5 MG tablet, Take 1 tablet by mouth daily., Disp: 30 tablet, Rfl: 1 .  fluticasone (FLONASE) 50 MCG/ACT nasal spray, Place 1 spray into both nostrils daily., Disp: , Rfl:  .  ranitidine (ZANTAC) 300 MG tablet, Take 300 mg by mouth at bedtime., Disp: , Rfl:  .  diphenhydrAMINE (BENADRYL) 25 MG tablet, Take 25 mg by mouth every 6 (six) hours as needed. Reported on 08/26/2015, Disp: , Rfl:   Review of Systems Constitutional: negative Gastrointestinal: negative Genitourinary: negative   Objective:  BP 148/80 mmHg  Ht 5\' 3"  (1.6 m)  Wt 159 lb (72.122 kg)  BMI 28.17 kg/m2   BMI: Body mass index is 28.17 kg/(m^2).  General Appearance: Alert, appropriate appearance for age. No acute distress HEENT: Grossly normal Neck / Thyroid:  Cardiovascular: RRR; normal S1, S2, no murmur Lungs: CTA bilaterally Back: No CVAT Breast Exam: No dimpling, nipple retraction or discharge. No masses or nodes., Normal to inspection, Normal breast tissue bilaterally and No masses or nodes.No dimpling, nipple retraction or discharge. Gastrointestinal: Soft, non-tender, no masses or organomegaly Pelvic Exam: Vulva and vagina appear normal. Bimanual exam reveals normal uterus and adnexa. External genitalia: normal general appearance  Urinary system: urethral meatus normal Vaginal: normal mucosa without prolapse or lesions, normal without tenderness, induration or masses and normal rugae Cervix: normal appearance and smooth surface Adnexa: normal bimanual exam Uterus: normal single, nontender Rectal: good sphincter tone, no masses and guaiac negative Rectovaginal: not indicated and normal rectal, no masses Lymphatic Exam: Non-palpable nodes in neck, clavicular, axillary, or inguinal regions  Skin: no rash or abnormalities Neurologic: Normal gait and speech, no tremor   Psychiatric: Alert and oriented, appropriate affect. Musculoskeletal: AK left shoulder.  Urinalysis:Not done [name]. MD Pgr [phone] 9:34 AM    By signing my name below, I, Terressa Koyanagi, attest that this documentation has been prepared under the direction and in the presence of [name], MD. Electronically Signed: Terressa Koyanagi, ED Scribe. 08/26/2015. 9:34 AM.   I personally performed the services described in this documentation, which was SCRIBED in my presence. The recorded information has been reviewed and considered accurate. It has been edited as necessary during review. [name], MD

## 2015-08-27 LAB — LIPID PANEL
CHOL/HDL RATIO: 5.1 ratio — AB (ref 0.0–4.4)
Cholesterol, Total: 238 mg/dL — ABNORMAL HIGH (ref 100–199)
HDL: 47 mg/dL (ref >39)
LDL Calculated: 167 mg/dL — ABNORMAL HIGH (ref 0–99)
Triglycerides: 119 mg/dL (ref 0–149)
VLDL CHOLESTEROL CAL: 24 mg/dL (ref 5–40)

## 2015-08-27 LAB — CBC
HEMOGLOBIN: 15 g/dL (ref 11.1–15.9)
Hematocrit: 43.8 % (ref 34.0–46.6)
MCH: 30.2 pg (ref 26.6–33.0)
MCHC: 34.2 g/dL (ref 31.5–35.7)
MCV: 88 fL (ref 79–97)
Platelets: 290 10*3/uL (ref 150–379)
RBC: 4.96 x10E6/uL (ref 3.77–5.28)
RDW: 14 % (ref 12.3–15.4)
WBC: 7 10*3/uL (ref 3.4–10.8)

## 2015-08-27 LAB — COMPREHENSIVE METABOLIC PANEL
A/G RATIO: 1.9 (ref 1.2–2.2)
ALT: 12 IU/L (ref 0–32)
AST: 17 IU/L (ref 0–40)
Albumin: 4.4 g/dL (ref 3.6–4.8)
Alkaline Phosphatase: 90 IU/L (ref 39–117)
BUN/Creatinine Ratio: 6 — ABNORMAL LOW (ref 11–26)
BUN: 7 mg/dL — AB (ref 8–27)
Bilirubin Total: 0.4 mg/dL (ref 0.0–1.2)
CALCIUM: 9 mg/dL (ref 8.7–10.3)
CO2: 21 mmol/L (ref 18–29)
CREATININE: 1.09 mg/dL — AB (ref 0.57–1.00)
Chloride: 100 mmol/L (ref 96–106)
GFR, EST AFRICAN AMERICAN: 63 mL/min/{1.73_m2} (ref >59)
GFR, EST NON AFRICAN AMERICAN: 55 mL/min/{1.73_m2} — AB (ref >59)
GLUCOSE: 110 mg/dL — AB (ref 65–99)
Globulin, Total: 2.3 g/dL (ref 1.5–4.5)
Potassium: 4.6 mmol/L (ref 3.5–5.2)
Sodium: 140 mmol/L (ref 134–144)
TOTAL PROTEIN: 6.7 g/dL (ref 6.0–8.5)

## 2015-08-27 LAB — CYTOLOGY - PAP

## 2015-08-27 LAB — TSH: TSH: 1.75 u[IU]/mL (ref 0.450–4.500)

## 2015-08-27 LAB — VITAMIN D 25 HYDROXY (VIT D DEFICIENCY, FRACTURES): VIT D 25 HYDROXY: 42.5 ng/mL (ref 30.0–100.0)

## 2015-09-28 ENCOUNTER — Telehealth: Payer: Self-pay | Admitting: *Deleted

## 2015-09-28 NOTE — Telephone Encounter (Signed)
Pt informed per Dr. Glo Herring Total Cholesterol 238, LDL 167, HDL 47, encouraged to diet and exercise, recheck in 3 months with lifestyle changes. Pap, Vit D, TSH, CBC all normal from 08/26/2015. Pt verbalized understanding.

## 2015-12-07 NOTE — Patient Instructions (Signed)
Kristi Boyd  12/07/2015     @PREFPERIOPPHARMACY @   Your procedure is scheduled on 12/14/2015.  Report to [facility] at 6:15 A.M.  Call this number if you have problems the morning of surgery:  [phone]   Remember:  Do not eat food or drink liquids after midnight.  Take these medicines the morning of surgery with A SIP OF WATER Valium, Flonase, Zantac, Estradol   Do not wear jewelry, make-up or nail polish.  Do not wear lotions, powders, or perfumes.  You may wear deoderant.  Do not shave 48 hours prior to surgery.  Men may shave face and neck.  Do not bring valuables to the hospital.  [facility] is not responsible for any belongings or valuables.  Contacts, dentures or bridgework may not be worn into surgery.  Leave your suitcase in the car.  After surgery it may be brought to your room.  For patients admitted to the hospital, discharge time will be determined by your treatment team.  Patients discharged the day of surgery will not be allowed to drive home.    Please read over the following fact sheets that you were given. Anesthesia Post-op Instructions     PATIENT INSTRUCTIONS POST-ANESTHESIA  IMMEDIATELY FOLLOWING SURGERY:  Do not drive or operate machinery for the first twenty four hours after surgery.  Do not make any important decisions for twenty four hours after surgery or while taking narcotic pain medications or sedatives.  If you develop intractable nausea and vomiting or a severe headache please notify your doctor immediately.  FOLLOW-UP:  Please make an appointment with your surgeon as instructed. You do not need to follow up with anesthesia unless specifically instructed to do so.  WOUND CARE INSTRUCTIONS (if applicable):  Keep a dry clean dressing on the anesthesia/puncture wound site if there is drainage.  Once the wound has quit draining you may leave it open to air.  Generally you should leave the bandage intact for twenty four hours unless there is  drainage.  If the epidural site drains for more than 36-48 hours please call the anesthesia department.  QUESTIONS?:  Please feel free to call your physician or the hospital operator if you have any questions, and they will be happy to assist you.       A cataract is a clouding of the lens of the eye. When a lens becomes cloudy, vision is reduced based on the degree and nature of the clouding. Surgery may be needed to improve vision. Surgery removes the cloudy lens and usually replaces it with a substitute lens (intraocular lens, IOL). LET YOUR EYE DOCTOR KNOW ABOUT:  Allergies to food or medicine.  Medicines taken including herbs, eye drops, over-the-counter medicines, and creams.  Use of steroids (by mouth or creams).  Previous problems with anesthetics or numbing medicine.  History of bleeding problems or blood clots.  Previous surgery.  Other health problems, including diabetes and kidney problems.  Possibility of pregnancy, if this applies. RISKS AND COMPLICATIONS  Infection.  Inflammation of the eyeball (endophthalmitis) that can spread to both eyes (sympathetic ophthalmia).  Poor wound healing.  If an IOL is inserted, it can later fall out of proper position. This is very uncommon.  Clouding of the part of your eye that holds an IOL in place. This is called an "after-cataract." These are uncommon but easily treated. BEFORE THE PROCEDURE  Do not eat or drink anything except small amounts of water for 8 to 12 before  your surgery, or as directed by your caregiver.  Unless you are told otherwise, continue any eye drops you have been prescribed.  Talk to your primary caregiver about all other medicines that you take (both prescription and nonprescription). In some cases, you may need to stop or change medicines near the time of your surgery. This is most important if you are taking blood-thinning medicine.Do not stop medicines unless you are told to do so.  Arrange for  someone to drive you to and from the procedure.  Do not put contact lenses in either eye on the day of your surgery. PROCEDURE There is more than one method for safely removing a cataract. Your doctor can explain the differences and help determine which is best for you. Phacoemulsification surgery is the most common form of cataract surgery.  An injection is given behind the eye or eye drops are given to make this a painless procedure.  A small cut (incision) is made on the edge of the clear, dome-shaped surface that covers the front of the eye (cornea).  A tiny probe is painlessly inserted into the eye. This device gives off ultrasound waves that soften and break up the cloudy center of the lens. This makes it easier for the cloudy lens to be removed by suction.  An IOL may be implanted.  The normal lens of the eye is covered by a clear capsule. Part of that capsule is intentionally left in the eye to support the IOL.  Your surgeon may or may not use stitches to close the incision. There are other forms of cataract surgery that require a larger incision and stitches to close the eye. This approach is taken in cases where the doctor feels that the cataract cannot be easily removed using phacoemulsification. AFTER THE PROCEDURE  When an IOL is implanted, it does not need care. It becomes a permanent part of your eye and cannot be seen or felt.  Your doctor will schedule follow-up exams to check on your progress.  Review your other medicines with your doctor to see which can be resumed after surgery.  Use eye drops or take medicine as prescribed by your doctor.   This information is not intended to replace advice given to you by your health care provider. Make sure you discuss any questions you have with your health care provider.   Document Released: 05/12/2011 Document Revised: 06/13/2014 Document Reviewed: 05/12/2011 Elsevier Interactive Patient Education [employer].

## 2015-12-09 ENCOUNTER — Encounter (HOSPITAL_COMMUNITY): Payer: Self-pay

## 2015-12-09 ENCOUNTER — Other Ambulatory Visit: Payer: Self-pay

## 2015-12-09 ENCOUNTER — Encounter (HOSPITAL_COMMUNITY)
Admission: RE | Admit: 2015-12-09 | Discharge: 2015-12-09 | Disposition: A | Discharge status: Home / Self Care | Payer: 59 | Source: Ambulatory Visit | Attending: Ophthalmology | Admitting: Ophthalmology

## 2015-12-09 DIAGNOSIS — Z01810 Encounter for preprocedural cardiovascular examination: Secondary | ICD-10-CM | POA: Insufficient documentation

## 2015-12-09 DIAGNOSIS — Z01812 Encounter for preprocedural laboratory examination: Secondary | ICD-10-CM | POA: Diagnosis not present

## 2015-12-09 LAB — BASIC METABOLIC PANEL
Anion gap: 8 (ref 5–15)
BUN: 13 mg/dL (ref 6–20)
CALCIUM: 9 mg/dL (ref 8.9–10.3)
CO2: 22 mmol/L (ref 22–32)
Chloride: 106 mmol/L (ref 101–111)
Creatinine, Ser: 1.1 mg/dL — ABNORMAL HIGH (ref 0.44–1.00)
GFR calc Af Amer: >60 mL/min (ref >60)
GFR, EST NON AFRICAN AMERICAN: 53 mL/min — AB (ref >60)
Glucose, Bld: 162 mg/dL — ABNORMAL HIGH (ref 65–99)
Potassium: 4.1 mmol/L (ref 3.5–5.1)
Sodium: 136 mmol/L (ref 135–145)

## 2015-12-09 LAB — CBC
HEMATOCRIT: 43.8 % (ref 36.0–46.0)
Hemoglobin: 14.7 g/dL (ref 12.0–15.0)
MCH: 30.3 pg (ref 26.0–34.0)
MCHC: 33.6 g/dL (ref 30.0–36.0)
MCV: 90.3 fL (ref 78.0–100.0)
Platelets: 273 10*3/uL (ref 150–400)
RBC: 4.85 MIL/uL (ref 3.87–5.11)
RDW: 13.4 % (ref 11.5–15.5)
WBC: 8.6 10*3/uL (ref 4.0–10.5)

## 2015-12-14 ENCOUNTER — Ambulatory Visit (HOSPITAL_COMMUNITY): Payer: 59 | Admitting: Anesthesiology

## 2015-12-14 ENCOUNTER — Ambulatory Visit (HOSPITAL_COMMUNITY)
Admission: RE | Admit: 2015-12-14 | Discharge: 2015-12-14 | Disposition: A | Discharge status: Home / Self Care | Payer: 59 | Source: Ambulatory Visit | Attending: Ophthalmology | Admitting: Ophthalmology

## 2015-12-14 ENCOUNTER — Encounter (HOSPITAL_COMMUNITY): Payer: Self-pay | Admitting: Anesthesiology

## 2015-12-14 ENCOUNTER — Encounter (HOSPITAL_COMMUNITY)
Admission: RE | Disposition: A | Discharge status: Home / Self Care | Payer: Self-pay | Source: Ambulatory Visit | Attending: Ophthalmology

## 2015-12-14 DIAGNOSIS — F419 Anxiety disorder, unspecified: Secondary | ICD-10-CM | POA: Diagnosis not present

## 2015-12-14 DIAGNOSIS — H2511 Age-related nuclear cataract, right eye: Secondary | ICD-10-CM | POA: Diagnosis present

## 2015-12-14 DIAGNOSIS — Z79899 Other long term (current) drug therapy: Secondary | ICD-10-CM | POA: Diagnosis not present

## 2015-12-14 DIAGNOSIS — Z7951 Long term (current) use of inhaled steroids: Secondary | ICD-10-CM | POA: Diagnosis not present

## 2015-12-14 DIAGNOSIS — F17200 Nicotine dependence, unspecified, uncomplicated: Secondary | ICD-10-CM | POA: Insufficient documentation

## 2015-12-14 DIAGNOSIS — Z79890 Hormone replacement therapy (postmenopausal): Secondary | ICD-10-CM | POA: Insufficient documentation

## 2015-12-14 DIAGNOSIS — K219 Gastro-esophageal reflux disease without esophagitis: Secondary | ICD-10-CM | POA: Diagnosis not present

## 2015-12-14 HISTORY — PX: CATARACT EXTRACTION W/PHACO: SHX586

## 2015-12-14 SURGERY — PHACOEMULSIFICATION, CATARACT, WITH IOL INSERTION
Anesthesia: Monitor Anesthesia Care | Site: Eye | Laterality: Right

## 2015-12-14 MED ORDER — CYCLOPENTOLATE-PHENYLEPHRINE 0.2-1 % OP SOLN
1.0000 [drp] | OPHTHALMIC | Status: AC
  Administered 2015-12-14 (×3): 1 [drp] via OPHTHALMIC

## 2015-12-14 MED ORDER — LIDOCAINE 3.5 % OP GEL OPTIME - NO CHARGE
OPHTHALMIC | Status: DC | PRN
  Administered 2015-12-14: 1 [drp] via OPHTHALMIC

## 2015-12-14 MED ORDER — BSS IO SOLN
INTRAOCULAR | Status: DC | PRN
  Administered 2015-12-14: 15 mL

## 2015-12-14 MED ORDER — LIDOCAINE HCL 3.5 % OP GEL
1.0000 | Freq: Once | OPHTHALMIC | Status: AC
Start: 2015-12-14 — End: 2015-12-14
  Administered 2015-12-14: 1 via OPHTHALMIC

## 2015-12-14 MED ORDER — EPINEPHRINE HCL 1 MG/ML IJ SOLN
INTRAMUSCULAR | Status: AC
  Filled 2015-12-14: qty 1

## 2015-12-14 MED ORDER — FENTANYL CITRATE (PF) 100 MCG/2ML IJ SOLN
25.0000 ug | INTRAMUSCULAR | Status: AC | PRN
  Administered 2015-12-14 (×2): 25 ug via INTRAVENOUS

## 2015-12-14 MED ORDER — PROVISC 10 MG/ML IO SOLN
INTRAOCULAR | Status: DC | PRN
  Administered 2015-12-14: 0.85 mL via INTRAOCULAR

## 2015-12-14 MED ORDER — LIDOCAINE HCL (PF) 1 % IJ SOLN
INTRAMUSCULAR | Status: DC | PRN
  Administered 2015-12-14: .4 mL

## 2015-12-14 MED ORDER — MIDAZOLAM HCL 2 MG/2ML IJ SOLN
INTRAMUSCULAR | Status: AC
  Filled 2015-12-14: qty 2

## 2015-12-14 MED ORDER — FENTANYL CITRATE (PF) 100 MCG/2ML IJ SOLN
INTRAMUSCULAR | Status: AC
  Filled 2015-12-14: qty 2

## 2015-12-14 MED ORDER — MIDAZOLAM HCL 2 MG/2ML IJ SOLN
1.0000 mg | INTRAMUSCULAR | Status: DC | PRN
  Administered 2015-12-14 (×2): 2 mg via INTRAVENOUS
  Filled 2015-12-14: qty 2

## 2015-12-14 MED ORDER — TETRACAINE HCL 0.5 % OP SOLN
1.0000 [drp] | OPHTHALMIC | Status: AC
  Administered 2015-12-14 (×3): 1 [drp] via OPHTHALMIC

## 2015-12-14 MED ORDER — EPINEPHRINE HCL 1 MG/ML IJ SOLN
INTRAMUSCULAR | Status: DC | PRN
  Administered 2015-12-14: 500 mL

## 2015-12-14 MED ORDER — NEOMYCIN-POLYMYXIN-DEXAMETH 3.5-10000-0.1 OP SUSP
OPHTHALMIC | Status: DC | PRN
  Administered 2015-12-14: 2 [drp] via OPHTHALMIC

## 2015-12-14 MED ORDER — LACTATED RINGERS IV SOLN
INTRAVENOUS | Status: DC
  Administered 2015-12-14: 1000 mL via INTRAVENOUS

## 2015-12-14 MED ORDER — PHENYLEPHRINE HCL 2.5 % OP SOLN
1.0000 [drp] | OPHTHALMIC | Status: AC
  Administered 2015-12-14 (×3): 1 [drp] via OPHTHALMIC

## 2015-12-14 MED ORDER — POVIDONE-IODINE 5 % OP SOLN
OPHTHALMIC | Status: DC | PRN
  Administered 2015-12-14: 1 via OPHTHALMIC

## 2015-12-14 SURGICAL SUPPLY — 11 items
CLOTH BEACON ORANGE TIMEOUT ST (SAFETY) ×2 IMPLANT
EYE SHIELD UNIVERSAL CLEAR (GAUZE/BANDAGES/DRESSINGS) ×2 IMPLANT
GLOVE BIOGEL PI IND STRL 7.0 (GLOVE) IMPLANT
GLOVE BIOGEL PI INDICATOR 7.0 (GLOVE) ×2
GLOVE EXAM NITRILE MD LF STRL (GLOVE) ×2 IMPLANT
PAD ARMBOARD 7.5X6 YLW CONV (MISCELLANEOUS) ×2 IMPLANT
SIGHTPATH CAT PROC W REG LENS (Ophthalmic Related) ×3 IMPLANT
SYRINGE LUER LOK 1CC (MISCELLANEOUS) ×2 IMPLANT
TAPE SURG TRANSPORE 1 IN (GAUZE/BANDAGES/DRESSINGS) IMPLANT
TAPE SURGICAL TRANSPORE 1 IN (GAUZE/BANDAGES/DRESSINGS) ×2
WATER STERILE IRR 250ML POUR (IV SOLUTION) ×2 IMPLANT

## 2015-12-14 NOTE — Op Note (Signed)
Date of Admission: 12/14/2015  Date of Surgery: 12/14/2015   Pre-Op Dx: Cataract Right Eye  Post-Op Dx: Senile Nuclear Cataract Right  Eye,  Dx Code H25.11  Surgeon: Tonny Branch, M.D.  Assistants: None  Anesthesia: Topical with MAC  Indications: Painless, progressive loss of vision with compromise of daily activities.  Surgery: Cataract Extraction with Intraocular lens Implant Right Eye  Discription: The patient had dilating drops and viscous lidocaine placed into the Right eye in the pre-op holding area. After transfer to the operating room, a time out was performed. The patient was then prepped and draped. Beginning with a 77 degree blade a paracentesis port was made at the surgeon's 2 o'clock position. The anterior chamber was then filled with 1% non-preserved lidocaine. This was followed by filling the anterior chamber with Provisc.  A 2.56mm keratome blade was used to make a clear corneal incision at the temporal limbus.  A bent cystatome needle was used to create a continuous tear capsulotomy. Hydrodissection was performed with balanced salt solution on a Fine canula. The lens nucleus was then removed using the phacoemulsification handpiece. Residual cortex was removed with the I&A handpiece. The anterior chamber and capsular bag were refilled with Provisc. A posterior chamber intraocular lens was placed into the capsular bag with it's injector. The implant was positioned with the Kuglan hook. The Provisc was then removed from the anterior chamber and capsular bag with the I&A handpiece. Stromal hydration of the main incision and paracentesis port was performed with BSS on a Fine canula. The wounds were tested for leak which was negative. The patient tolerated the procedure well. There were no operative complications. The patient was then transferred to the recovery room in stable condition.  Complications: None  Specimen: None  EBL: None  Prosthetic device: Hoya iSert 250, power 20.5 D,  SN [id number].

## 2015-12-14 NOTE — Transfer of Care (Signed)
Immediate Anesthesia Transfer of Care Note  Patient: Kristi Boyd  Procedure(s) Performed: Procedure(s) with comments: CATARACT EXTRACTION PHACO AND INTRAOCULAR LENS PLACEMENT (IOC) (Right) - CDE: 4.70  Patient Location: Short Stay  Anesthesia Type:MAC  Level of Consciousness: awake  Airway & Oxygen Therapy: Patient Spontanous Breathing  Post-op Assessment: Report given to RN  Post vital signs: Reviewed  Last Vitals:  Filed Vitals:   12/14/15 0740 12/14/15 0745  BP: 140/76 131/79  Temp:    Resp: 24 17    Last Pain: There were no vitals filed for this visit.    Patients Stated Pain Goal: 7 ([id number])  Complications: No apparent anesthesia complications

## 2015-12-14 NOTE — H&P (Signed)
I have reviewed the H&P, the patient was re-examined, and I have identified no interval changes in medical condition and plan of care since the history and physical of record  

## 2015-12-14 NOTE — Discharge Instructions (Signed)
General Anesthesia, Adult °General anesthesia is a sleep-like state of non-feeling produced by medicines (anesthetics). General anesthesia prevents you from being alert and feeling pain during a medical procedure. Your caregiver may recommend general anesthesia if your procedure: °· Is long. °· Is painful or uncomfortable. °· Would be frightening to see or hear. °· Requires you to be still. °· Affects your breathing. °· Causes significant blood loss. °LET YOUR CAREGIVER KNOW ABOUT: °· Allergies to food or medicine. °· Medicines taken, including vitamins, herbs, eyedrops, over-the-counter medicines, and creams. °· Use of steroids (by mouth or creams). °· Previous problems with anesthetics or numbing medicines, including problems experienced by relatives. °· History of bleeding problems or blood clots. °· Previous surgeries and types of anesthetics received. °· Possibility of pregnancy, if this applies. °· Use of cigarettes, alcohol, or illegal drugs. °· Any health condition(s), especially diabetes, sleep apnea, and high blood pressure. °RISKS AND COMPLICATIONS °General anesthesia rarely causes complications. However, if complications do occur, they can be life threatening. Complications include: °· A lung infection. °· A stroke. °· A heart attack. °· Waking up during the procedure. When this occurs, the patient may be unable to move and communicate that he or she is awake. The patient may feel severe pain. °Older adults and adults with serious medical problems are more likely to have complications than adults who are young and healthy. Some complications can be prevented by answering all of your caregiver's questions thoroughly and by following all pre-procedure instructions. It is important to tell your caregiver if any of the pre-procedure instructions, especially those related to diet, were not followed. Any food or liquid in the stomach can cause problems when you are under general anesthesia. °BEFORE THE  PROCEDURE °· Ask your caregiver if you will have to spend the night at the hospital. If you will not have to spend the night, arrange to have an adult drive you and stay with you for 24 hours. °· Follow your caregiver's instructions if you are taking dietary supplements or medicines. Your caregiver may tell you to stop taking them or to reduce your dosage. °· Do not smoke for as long as possible before your procedure. If possible, stop smoking 3-6 weeks before the procedure. °· Do not take new dietary supplements or medicines within 1 week of your procedure unless your caregiver approves them. °· Do not eat within 8 hours of your procedure or as directed by your caregiver. Drink only clear liquids, such as water, black coffee (without milk or cream), and fruit juices (without pulp). °· Do not drink within 3 hours of your procedure or as directed by your caregiver. °· You may brush your teeth on the morning of the procedure, but make sure to spit out the toothpaste and water when finished. °PROCEDURE  °You will receive anesthetics through a mask, through an intravenous (IV) access tube, or through both. A doctor who specializes in anesthesia (anesthesiologist) or a nurse who specializes in anesthesia (nurse anesthetist) or both will stay with you throughout the procedure to make sure you remain unconscious. He or she will also watch your blood pressure, pulse, and oxygen levels to make sure that the anesthetics do not cause any problems. Once you are asleep, a breathing tube or mask may be used to help you breathe. °AFTER THE PROCEDURE °You will wake up after the procedure is complete. You may be in the room where the procedure was performed or in a recovery area. You may have a sore throat   if a breathing tube was used. You may also feel: °· Dizzy. °· Weak. °· Drowsy. °· Confused. °· Nauseous. °· Cold. °These are all normal responses and can be expected to last for up to 24 hours after the procedure is complete. A  caregiver will tell you when you are ready to go home. This will usually be when you are fully awake and in stable condition. °  °This information is not intended to replace advice given to you by your health care provider. Make sure you discuss any questions you have with your health care provider. °  °Document Released: 08/30/2007 Document Revised: 06/13/2014 Document Reviewed: 09/21/2011 °Elsevier Interactive Patient Education ©2016 [employer] ° °

## 2015-12-14 NOTE — Anesthesia Postprocedure Evaluation (Signed)
Anesthesia Post Note  Patient: Kristi Boyd  Procedure(s) Performed: Procedure(s) (LRB): CATARACT EXTRACTION PHACO AND INTRAOCULAR LENS PLACEMENT (IOC) (Right)  Patient location during evaluation: Short Stay Anesthesia Type: MAC Level of consciousness: awake and awake and alert Pain management: pain level controlled Respiratory status: spontaneous breathing Cardiovascular status: blood pressure returned to baseline Postop Assessment: no headache Anesthetic complications: no    Last Vitals:  Filed Vitals:   12/14/15 0740 12/14/15 0745  BP: 140/76 131/79  Temp:    Resp: 24 17    Last Pain: There were no vitals filed for this visit.               Brittanyann Wittner

## 2015-12-14 NOTE — Anesthesia Preprocedure Evaluation (Signed)
Anesthesia Evaluation  Patient identified by MRN, date of birth, ID band Patient awake    Reviewed: Allergy & Precautions, NPO status , Patient's Chart, lab work & pertinent test results  Airway Mallampati: II  TM Distance: >3 FB     Dental  (+) Teeth Intact, Implants   Pulmonary Current Smoker,    breath sounds clear to auscultation       Cardiovascular negative cardio ROS   Rhythm:Regular Rate:Normal     Neuro/Psych PSYCHIATRIC DISORDERS Anxiety    GI/Hepatic PUD,   Endo/Other    Renal/GU      Musculoskeletal   Abdominal   Peds  Hematology   Anesthesia Other Findings   Reproductive/Obstetrics                             Anesthesia Physical Anesthesia Plan  ASA: II  Anesthesia Plan: MAC   Post-op Pain Management:    Induction: Intravenous  Airway Management Planned: Nasal Cannula  Additional Equipment:   Intra-op Plan:   Post-operative Plan:   Informed Consent: I have reviewed the patients History and Physical, chart, labs and discussed the procedure including the risks, benefits and alternatives for the proposed anesthesia with the patient or authorized representative who has indicated his/her understanding and acceptance.     Plan Discussed with:   Anesthesia Plan Comments:         Anesthesia Quick Evaluation

## 2015-12-17 ENCOUNTER — Encounter (HOSPITAL_COMMUNITY): Payer: Self-pay | Admitting: Ophthalmology

## 2015-12-30 ENCOUNTER — Encounter (HOSPITAL_COMMUNITY)
Admission: RE | Admit: 2015-12-30 | Discharge: 2015-12-30 | Disposition: A | Discharge status: Home / Self Care | Payer: 59 | Source: Ambulatory Visit | Attending: Ophthalmology | Admitting: Ophthalmology

## 2015-12-30 ENCOUNTER — Encounter (HOSPITAL_COMMUNITY): Payer: Self-pay

## 2015-12-31 ENCOUNTER — Ambulatory Visit (HOSPITAL_COMMUNITY): Payer: 59 | Admitting: Anesthesiology

## 2015-12-31 ENCOUNTER — Encounter (HOSPITAL_COMMUNITY): Payer: Self-pay | Admitting: Ophthalmology

## 2015-12-31 ENCOUNTER — Encounter (HOSPITAL_COMMUNITY)
Admission: RE | Disposition: A | Discharge status: Home / Self Care | Payer: Self-pay | Source: Ambulatory Visit | Attending: Ophthalmology

## 2015-12-31 ENCOUNTER — Ambulatory Visit (HOSPITAL_COMMUNITY)
Admission: RE | Admit: 2015-12-31 | Discharge: 2015-12-31 | Disposition: A | Discharge status: Home / Self Care | Payer: 59 | Source: Ambulatory Visit | Attending: Ophthalmology | Admitting: Ophthalmology

## 2015-12-31 DIAGNOSIS — H25812 Combined forms of age-related cataract, left eye: Secondary | ICD-10-CM | POA: Diagnosis not present

## 2015-12-31 DIAGNOSIS — F17210 Nicotine dependence, cigarettes, uncomplicated: Secondary | ICD-10-CM | POA: Insufficient documentation

## 2015-12-31 DIAGNOSIS — F419 Anxiety disorder, unspecified: Secondary | ICD-10-CM | POA: Insufficient documentation

## 2015-12-31 DIAGNOSIS — Z8711 Personal history of peptic ulcer disease: Secondary | ICD-10-CM | POA: Diagnosis not present

## 2015-12-31 HISTORY — PX: CATARACT EXTRACTION W/PHACO: SHX586

## 2015-12-31 SURGERY — PHACOEMULSIFICATION, CATARACT, WITH IOL INSERTION
Anesthesia: Monitor Anesthesia Care | Site: Eye | Laterality: Left

## 2015-12-31 MED ORDER — BSS IO SOLN
INTRAOCULAR | Status: DC | PRN
  Administered 2015-12-31: 15 mL via INTRAOCULAR

## 2015-12-31 MED ORDER — CYCLOPENTOLATE-PHENYLEPHRINE 0.2-1 % OP SOLN
1.0000 [drp] | OPHTHALMIC | Status: AC
  Administered 2015-12-31 (×3): 1 [drp] via OPHTHALMIC

## 2015-12-31 MED ORDER — TETRACAINE HCL 0.5 % OP SOLN
1.0000 [drp] | OPHTHALMIC | Status: AC
  Administered 2015-12-31 (×3): 1 [drp] via OPHTHALMIC

## 2015-12-31 MED ORDER — EPINEPHRINE HCL 1 MG/ML IJ SOLN
INTRAMUSCULAR | Status: AC
  Filled 2015-12-31: qty 1

## 2015-12-31 MED ORDER — PHENYLEPHRINE HCL 2.5 % OP SOLN
1.0000 [drp] | OPHTHALMIC | Status: AC
  Administered 2015-12-31 (×3): 1 [drp] via OPHTHALMIC

## 2015-12-31 MED ORDER — LACTATED RINGERS IV SOLN
INTRAVENOUS | Status: DC
  Administered 2015-12-31: 11:00:00 via INTRAVENOUS

## 2015-12-31 MED ORDER — LIDOCAINE HCL 3.5 % OP GEL
1.0000 "application " | Freq: Once | OPHTHALMIC | Status: AC
  Administered 2015-12-31: 1 via OPHTHALMIC

## 2015-12-31 MED ORDER — LIDOCAINE HCL (PF) 1 % IJ SOLN
INTRAMUSCULAR | Status: DC | PRN
  Administered 2015-12-31: .5 mL

## 2015-12-31 MED ORDER — FENTANYL CITRATE (PF) 100 MCG/2ML IJ SOLN
INTRAMUSCULAR | Status: AC
  Filled 2015-12-31: qty 2

## 2015-12-31 MED ORDER — MIDAZOLAM HCL 2 MG/2ML IJ SOLN
INTRAMUSCULAR | Status: AC
Start: 2015-12-31 — End: 2015-12-31
  Filled 2015-12-31: qty 2

## 2015-12-31 MED ORDER — FENTANYL CITRATE (PF) 100 MCG/2ML IJ SOLN
25.0000 ug | INTRAMUSCULAR | Status: AC | PRN
Start: 2015-12-31 — End: 2015-12-31
  Administered 2015-12-31 (×2): 25 ug via INTRAVENOUS

## 2015-12-31 MED ORDER — POVIDONE-IODINE 5 % OP SOLN
OPHTHALMIC | Status: DC | PRN
  Administered 2015-12-31: 1 via OPHTHALMIC

## 2015-12-31 MED ORDER — MIDAZOLAM HCL 2 MG/2ML IJ SOLN
1.0000 mg | INTRAMUSCULAR | Status: DC | PRN
  Administered 2015-12-31: 2 mg via INTRAVENOUS

## 2015-12-31 MED ORDER — EPINEPHRINE HCL 1 MG/ML IJ SOLN
INTRAOCULAR | Status: DC | PRN
  Administered 2015-12-31: 500 mL

## 2015-12-31 MED ORDER — NEOMYCIN-POLYMYXIN-DEXAMETH 3.5-10000-0.1 OP SUSP
OPHTHALMIC | Status: DC | PRN
  Administered 2015-12-31: 2 [drp] via OPHTHALMIC

## 2015-12-31 MED ORDER — PROVISC 10 MG/ML IO SOLN
INTRAOCULAR | Status: DC | PRN
  Administered 2015-12-31: 0.85 mL via INTRAOCULAR

## 2015-12-31 SURGICAL SUPPLY — 25 items
CAPSULAR TENSION RING-AMO (OPHTHALMIC RELATED) IMPLANT
CLOTH BEACON ORANGE TIMEOUT ST (SAFETY) ×2 IMPLANT
EYE SHIELD UNIVERSAL CLEAR (GAUZE/BANDAGES/DRESSINGS) ×2 IMPLANT
GLOVE BIOGEL PI IND STRL 6.5 (GLOVE) IMPLANT
GLOVE BIOGEL PI IND STRL 7.0 (GLOVE) IMPLANT
GLOVE BIOGEL PI IND STRL 7.5 (GLOVE) IMPLANT
GLOVE BIOGEL PI INDICATOR 6.5 (GLOVE) ×2
GLOVE BIOGEL PI INDICATOR 7.0 (GLOVE)
GLOVE BIOGEL PI INDICATOR 7.5 (GLOVE)
GLOVE EXAM NITRILE LRG STRL (GLOVE) IMPLANT
GLOVE EXAM NITRILE MD LF STRL (GLOVE) ×2 IMPLANT
KIT VITRECTOMY (OPHTHALMIC RELATED) IMPLANT
LENS INTRAOCULAR RESTOR ×2 IMPLANT
PAD ARMBOARD 7.5X6 YLW CONV (MISCELLANEOUS) ×2 IMPLANT
PROC W NO LENS (INTRAOCULAR LENS)
PROC W SPEC LENS (INTRAOCULAR LENS) ×3
PROCESS W NO LENS (INTRAOCULAR LENS) IMPLANT
PROCESS W SPEC LENS (INTRAOCULAR LENS) IMPLANT
RETRACTOR IRIS SIGHTPATH (OPHTHALMIC RELATED) IMPLANT
RING MALYGIN (MISCELLANEOUS) IMPLANT
SYRINGE LUER LOK 1CC (MISCELLANEOUS) ×2 IMPLANT
TAPE SURG TRANSPORE 1 IN (GAUZE/BANDAGES/DRESSINGS) IMPLANT
TAPE SURGICAL TRANSPORE 1 IN (GAUZE/BANDAGES/DRESSINGS) ×2
VISCOELASTIC ADDITIONAL (OPHTHALMIC RELATED) IMPLANT
WATER STERILE IRR 250ML POUR (IV SOLUTION) ×2 IMPLANT

## 2015-12-31 NOTE — Transfer of Care (Signed)
Immediate Anesthesia Transfer of Care Note  Patient: Kristi Boyd  Procedure(s) Performed: Procedure(s): CATARACT EXTRACTION PHACO AND INTRAOCULAR LENS PLACEMENT LEFT EYE; CDE:6.39 (Left)  Patient Location: Short Stay  Anesthesia Type:MAC  Level of Consciousness: awake, alert , oriented and patient cooperative  Airway & Oxygen Therapy: Patient Spontanous Breathing  Post-op Assessment: Report given to RN, Post -op Vital signs reviewed and stable and Patient moving all extremities  Post vital signs: Reviewed and stable  Last Vitals:  Vitals:   12/31/15 1100 12/31/15 1105  BP:  121/72  Pulse:    Resp: (!) 24 18  Temp:      Last Pain:  Vitals:   12/31/15 1040  TempSrc: Oral      Patients Stated Pain Goal: 7 ([id number])  Complications: No apparent anesthesia complications

## 2015-12-31 NOTE — Anesthesia Preprocedure Evaluation (Signed)
Anesthesia Evaluation  Patient identified by MRN, date of birth, ID band Patient awake    Reviewed: Allergy & Precautions, NPO status , Patient's Chart, lab work & pertinent test results  Airway Mallampati: II  TM Distance: >3 FB     Dental  (+) Teeth Intact, Implants   Pulmonary Current Smoker,    breath sounds clear to auscultation       Cardiovascular negative cardio ROS   Rhythm:Regular Rate:Normal     Neuro/Psych PSYCHIATRIC DISORDERS Anxiety    GI/Hepatic PUD,   Endo/Other    Renal/GU      Musculoskeletal   Abdominal   Peds  Hematology   Anesthesia Other Findings   Reproductive/Obstetrics                             Anesthesia Physical Anesthesia Plan  ASA: II  Anesthesia Plan: MAC   Post-op Pain Management:    Induction: Intravenous  Airway Management Planned: Nasal Cannula  Additional Equipment:   Intra-op Plan:   Post-operative Plan:   Informed Consent: I have reviewed the patients History and Physical, chart, labs and discussed the procedure including the risks, benefits and alternatives for the proposed anesthesia with the patient or authorized representative who has indicated his/her understanding and acceptance.     Plan Discussed with:   Anesthesia Plan Comments:         Anesthesia Quick Evaluation

## 2015-12-31 NOTE — H&P (Signed)
I have reviewed the H&P, the patient was re-examined, and I have identified no interval changes in medical condition and plan of care since the history and physical of record  

## 2015-12-31 NOTE — Discharge Instructions (Signed)

## 2015-12-31 NOTE — Anesthesia Postprocedure Evaluation (Signed)
Anesthesia Post Note  Patient: Kristi Boyd  Procedure(s) Performed: Procedure(s) (LRB): CATARACT EXTRACTION PHACO AND INTRAOCULAR LENS PLACEMENT LEFT EYE; CDE:6.39 (Left)  Patient location during evaluation: Short Stay Anesthesia Type: MAC Level of consciousness: awake and alert, oriented and patient cooperative Pain management: pain level controlled Vital Signs Assessment: post-procedure vital signs reviewed and stable Respiratory status: spontaneous breathing, nonlabored ventilation and respiratory function stable Cardiovascular status: blood pressure returned to baseline Postop Assessment: no headache Anesthetic complications: no    Last Vitals:  Vitals:   12/31/15 1100 12/31/15 1105  BP:  121/72  Pulse:    Resp: (!) 24 18  Temp:      Last Pain:  Vitals:   12/31/15 1040  TempSrc: Oral                 [name]

## 2015-12-31 NOTE — Op Note (Signed)
Date of Admission: 12/31/2015  Date of Surgery: 12/31/2015   Pre-Op Dx: Cataract Left Eye  Post-Op Dx: Senile Combined Cataract Left  Eye,  Dx Code [id number]  Surgeon: Tonny Branch, M.D.  Assistants: None  Anesthesia: Topical with MAC  Indications: Painless, progressive loss of vision with compromise of daily activities.  Surgery: Cataract Extraction with Intraocular lens Implant Left Eye  Discription: The patient had dilating drops and viscous lidocaine placed into the Left eye in the pre-op holding area. After transfer to the operating room, a time out was performed. The patient was then prepped and draped. Beginning with a 60 degree blade a paracentesis port was made at the surgeon's 2 o'clock position. The anterior chamber was then filled with 1% non-preserved lidocaine. This was followed by filling the anterior chamber with Provisc.  A 2.53mm keratome blade was used to make a clear corneal incision at the temporal limbus.  A bent cystatome needle was used to create a continuous tear capsulotomy. Hydrodissection was performed with balanced salt solution on a Fine canula. The lens nucleus was then removed using the phacoemulsification handpiece. Residual cortex was removed with the I&A handpiece. The anterior chamber and capsular bag were refilled with Provisc. A posterior chamber intraocular lens was placed into the capsular bag with it's injector. The implant was positioned with the Kuglan hook. The Provisc was then removed from the anterior chamber and capsular bag with the I&A handpiece. Stromal hydration of the main incision and paracentesis port was performed with BSS on a Fine canula. The wounds were tested for leak which was negative. The patient tolerated the procedure well. There were no operative complications. The patient was then transferred to the recovery room in stable condition.  Complications: None  Specimen: None  EBL: None  Prosthetic device: Alcon ReStor [id number], power 21.5  D, SN [id number].

## 2016-01-06 ENCOUNTER — Encounter (HOSPITAL_COMMUNITY): Payer: Self-pay | Admitting: Ophthalmology

## 2016-02-04 ENCOUNTER — Other Ambulatory Visit: Payer: Self-pay | Admitting: Obstetrics and Gynecology

## 2016-02-05 ENCOUNTER — Other Ambulatory Visit: Payer: Self-pay | Admitting: Obstetrics and Gynecology

## 2016-02-16 ENCOUNTER — Encounter (INDEPENDENT_AMBULATORY_CARE_PROVIDER_SITE_OTHER): Payer: Self-pay

## 2016-02-16 ENCOUNTER — Encounter: Payer: Self-pay | Admitting: Obstetrics and Gynecology

## 2016-02-16 ENCOUNTER — Ambulatory Visit (INDEPENDENT_AMBULATORY_CARE_PROVIDER_SITE_OTHER): Payer: 59 | Admitting: Obstetrics and Gynecology

## 2016-02-16 DIAGNOSIS — N951 Menopausal and female climacteric states: Secondary | ICD-10-CM | POA: Diagnosis not present

## 2016-02-16 MED ORDER — DIAZEPAM 10 MG PO TABS: ORAL_TABLET | ORAL | 5 refills | Status: DC

## 2016-02-16 NOTE — Progress Notes (Addendum)
[facility] Visit  02/16/16            Patient name: Kristi Boyd MRN [id number]  Date of birth: 08/21/1955  CC & HPI:  Kristi Boyd is a 60 y.o. female presenting today for medication refill. Pt states that she needs a refill of her valium and activella. Pt was doing well on activella and was switched to a generic version and given estradiol as a booster supplement. Pt also is voicing concerns for why she is taking activella and estradiol. Pt has not tried any medications for the relief of her symptoms. Pt denies any other symptoms.   ROS:  ROS   Pertinent History Reviewed:   Reviewed: Significant for  Medical         Past Medical History:  Diagnosis Date   Allergy    Disc degeneration    Plantar fasciitis    Ulcer    Vitamin D deficiency                               Surgical Hx:    Past Surgical History:  Procedure Laterality Date   BACK SURGERY     CATARACT EXTRACTION W/PHACO Right 12/14/2015   Procedure: CATARACT EXTRACTION PHACO AND INTRAOCULAR LENS PLACEMENT (IOC);  Surgeon: Tonny Branch, MD;  Location: [facility];  Service: Ophthalmology;  Laterality: Right;  CDE: 4.70   CATARACT EXTRACTION W/PHACO Left 12/31/2015   Procedure: CATARACT EXTRACTION PHACO AND INTRAOCULAR LENS PLACEMENT LEFT EYE; CDE:6.39;  Surgeon: Tonny Branch, MD;  Location: [facility];  Service: Ophthalmology;  Laterality: Left;   DILATION AND CURETTAGE OF UTERUS     FOOT SURGERY     LEEP  2007   NECK SURGERY     SPINE SURGERY     TUBAL LIGATION     Medications: Reviewed & Updated - see associated section                       Current Outpatient Prescriptions:    cetirizine (ZYRTEC) 10 MG tablet, Take 10 mg by mouth daily as needed for allergies. , Disp: , Rfl:    Cholecalciferol ([employer] VIT D 5000 HIGH-POTENCY) 5000 units capsule, Take 5,000 Units by mouth daily., Disp: , Rfl:    diazepam (VALIUM) 10 MG tablet, TAKE (1) TABLET BY MOUTH AT BEDTIME. (Patient taking differently: TAKE (1)  TABLET BY MOUTH AT am), Disp: 30 tablet, Rfl: 5   estradiol (ESTRACE) 1 MG tablet, Take 1 mg by mouth daily. , Disp: , Rfl:    estradiol-norethindrone (ACTIVELLA) 1-0.5 MG tablet, Take 1 tablet by mouth daily., Disp: 30 tablet, Rfl: 1   fluticasone (FLONASE) 50 MCG/ACT nasal spray, Place 1 spray into both nostrils daily., Disp: , Rfl:    ranitidine (ZANTAC) 300 MG tablet, Take 300 mg by mouth at bedtime., Disp: , Rfl:    Social History: Reviewed -  reports that she has been smoking Cigarettes.  She has a 16.00 pack-year smoking history. She has never used smokeless tobacco.  Objective Findings:  Vitals: Blood pressure (!) 164/100, pulse 72, height 5\' 3"  (1.6 m), weight 152 lb 9.6 oz (69.2 kg).   Discussed with pt risks and benefits of medication refills. At end of discussion, pt had opportunity to ask questions and has no further questions at this time.   Greater than 50% was spent in counseling and coordination of care with the patient.  Total time greater than: 25 minutes   Assessment & Plan:   A:  1. Medication refill   P:  1. Refill valium 10 mg x 30 tablets with 5 refills. 2. Vivelle-dot Rx, maximum generic dosage 2. Follow up in 1 year   By signing my name below, I, Soijett Blue, attest that this documentation has been prepared under the direction and in the presence of [name], MD. Electronically Signed: [place], ED Scribe. 02/16/16. 4:24 PM.   I personally performed the services described in this documentation, which was SCRIBED in my presence. The recorded information has been reviewed and considered accurate. It has been edited as necessary during review. [name], MD

## 2016-02-16 NOTE — Progress Notes (Signed)
° [facility] Visit  °02/16/16            Patient name: Aura R Collymore MRN [id number]  Date of birth: 07/02/1955 ° °CC & HPI:  °Shaliah R Riggenbach is a 60 y.o. female presenting today for medication refill. Pt states that she needs a refill of her valium and activella. Pt was doing well on activella and was switched to a generic version and given estradiol as a booster supplement. Pt also is voicing concerns for why she is taking activella and estradiol. Pt has not tried any medications for the relief of her symptoms. Pt denies any other symptoms.  ° °ROS:  °ROS ° ° °Pertinent History Reviewed:  ° Reviewed: Significant for  °Medical         °Past Medical History:  °Diagnosis Date  °• Allergy   °• Disc degeneration   °• Plantar fasciitis   °• Ulcer   °• Vitamin D deficiency   °                            °Surgical Hx:    °Past Surgical History:  °Procedure Laterality Date  °• BACK SURGERY    °• CATARACT EXTRACTION W/PHACO Right 12/14/2015  ° Procedure: CATARACT EXTRACTION PHACO AND INTRAOCULAR LENS PLACEMENT (IOC);  Surgeon: Kerry Hunt, MD;  Location: [facility];  Service: Ophthalmology;  Laterality: Right;  CDE: 4.70  °• CATARACT EXTRACTION W/PHACO Left 12/31/2015  ° Procedure: CATARACT EXTRACTION PHACO AND INTRAOCULAR LENS PLACEMENT LEFT EYE; CDE:6.39;  Surgeon: Kerry Hunt, MD;  Location: [facility];  Service: Ophthalmology;  Laterality: Left;  °• DILATION AND CURETTAGE OF UTERUS    °• FOOT SURGERY    °• LEEP  2007  °• NECK SURGERY    °• SPINE SURGERY    °• TUBAL LIGATION    ° °Medications: Reviewed & Updated - see associated section °                      °Current Outpatient Prescriptions:  °•  cetirizine (ZYRTEC) 10 MG tablet, Take 10 mg by mouth daily as needed for allergies. , Disp: , Rfl:  °•  Cholecalciferol ([employer]S VIT D 5000 HIGH-POTENCY) 5000 units capsule, Take 5,000 Units by mouth daily., Disp: , Rfl:  °•  diazepam (VALIUM) 10 MG tablet, TAKE (1) TABLET BY MOUTH AT BEDTIME. (Patient taking differently: TAKE (1)  TABLET BY MOUTH AT am), Disp: 30 tablet, Rfl: 5 °•  estradiol (ESTRACE) 1 MG tablet, Take 1 mg by mouth daily. , Disp: , Rfl:  °•  estradiol-norethindrone (ACTIVELLA) 1-0.5 MG tablet, Take 1 tablet by mouth daily., Disp: 30 tablet, Rfl: 1 °•  fluticasone (FLONASE) 50 MCG/ACT nasal spray, Place 1 spray into both nostrils daily., Disp: , Rfl:  °•  ranitidine (ZANTAC) 300 MG tablet, Take 300 mg by mouth at bedtime., Disp: , Rfl:  ° ° °Social History: Reviewed -  reports that she has been smoking Cigarettes.  She has a 16.00 pack-year smoking history. She has never used smokeless tobacco. ° °Objective Findings:  °Vitals: Blood pressure (!) 164/100, pulse 72, height 5' 3" (1.6 m), weight 152 lb 9.6 oz (69.2 kg). ° ° °Discussed with pt risks and benefits of medication refills. At end of discussion, pt had opportunity to ask questions and has no further questions at this time.  ° °Greater than 50% was spent in counseling and coordination of care with the patient.   time greater than: 25 minutes   Assessment & Plan:   A:  1. Medication refill   P:  1. Refill valium 10 mg x 30 tablets with 5 refills. 2. Vivelle-dot Rx, maximum generic dosage 2. Follow up in 1 year   By signing my name below, I, Soijett Blue, attest that this documentation has been prepared under the direction and in the presence of [name], MD. Electronically Signed: Raymond, ED Scribe. 02/16/16. 4:24 PM.   I personally performed the services described in this documentation, which was SCRIBED in my presence. The recorded information has been reviewed and considered accurate. It has been edited as necessary during review. [name], MD

## 2016-04-13 ENCOUNTER — Ambulatory Visit (INDEPENDENT_AMBULATORY_CARE_PROVIDER_SITE_OTHER): Payer: 59 | Admitting: Adult Health

## 2016-04-13 ENCOUNTER — Encounter: Payer: Self-pay | Admitting: Adult Health

## 2016-04-13 VITALS — BP 160/80 | HR 84 | Ht 64.0 in | Wt 143.0 lb

## 2016-04-13 DIAGNOSIS — N930 Postcoital and contact bleeding: Secondary | ICD-10-CM | POA: Diagnosis not present

## 2016-04-13 DIAGNOSIS — N950 Postmenopausal bleeding: Secondary | ICD-10-CM

## 2016-04-13 DIAGNOSIS — Z79890 Hormone replacement therapy (postmenopausal): Secondary | ICD-10-CM | POA: Insufficient documentation

## 2016-04-13 NOTE — Progress Notes (Signed)
Subjective:     Patient ID: Kristi Boyd, female   DOB: 1956/01/26, 60 y.o.   MRN: [id number]  HPI Kymberly is a 60 year old white female in complaining of having had vaginal bleeding that started after sex and lasted 3-4 days about 9 days ago.She is on HRT, and had normal pap with negative HPV in March, 2017.  Review of Systems +vaginal bleeding about 9 days ago for 3-4 days, happened after sex Reviewed past medical,surgical, social and family history. Reviewed medications and allergies.     Objective:   Physical Exam BP (!) 160/80 (BP Location: Left Arm, Patient Position: Sitting, Cuff Size: Normal)   Pulse 84   Ht 5\' 4"  (1.626 m)   Wt 143 lb (64.9 kg)   BMI 24.55 kg/m  Skin warm and dry.Pelvic: external genitalia is normal in appearance no lesions, vagina: pale with loss of moisture and rugae, no abrasions or tears,urethra has no lesions or masses noted, cervix:smooth,stentotic os,no CMT, uterus: normal size, shape and contour, non tender, no masses felt, adnexa: no masses or tenderness noted. Bladder is non tender and no masses felt.  PHQ 2 score 0.   Will get [place] to assess uterus, and endometrial lining.   Assessment:     1. PMB (postmenopausal bleeding)   2. Hormone replacement therapy, postmenopausal       Plan:     Return in 1 week for GYN [place] Review handout on PMB

## 2016-04-13 NOTE — Patient Instructions (Signed)
Get  GYN [place] in 1 week Postmenopausal Bleeding Postmenopausal bleeding is any bleeding a woman has after she has entered into menopause. Menopause is the end of a woman's fertile years. After menopause, a woman no longer ovulates or has menstrual periods.  Postmenopausal bleeding can be caused by various things. Any type of postmenopausal bleeding, even if it appears to be a typical menstrual period, is concerning. This should be evaluated by your health care provider. Any treatment will depend on the cause of the bleeding. HOME CARE INSTRUCTIONS Monitor your condition for any changes. The following actions may help to alleviate any discomfort you are experiencing:  Avoid the use of tampons and douches as directed by your health care provider.  Change your pads frequently.  Get regular pelvic exams and Pap tests.  Keep all follow-up appointments for diagnostic tests as directed by your health care provider. SEEK MEDICAL CARE IF:   Your bleeding lasts more than 1 week.  You have abdominal pain.  You have bleeding with sexual intercourse. SEEK IMMEDIATE MEDICAL CARE IF:   You have a fever, chills, headache, dizziness, muscle aches, and bleeding.  You have severe pain with bleeding.  You are passing blood clots.  You have bleeding and need more than 1 pad an hour.  You feel faint. MAKE SURE YOU:  Understand these instructions.  Will watch your condition.  Will get help right away if you are not doing well or get worse.   This information is not intended to replace advice given to you by your health care provider. Make sure you discuss any questions you have with your health care provider.   Document Released: 08/31/2005 Document Revised: 03/13/2013 Document Reviewed: 12/20/2012 Elsevier Interactive Patient Education [employer].

## 2016-04-15 ENCOUNTER — Ambulatory Visit: Payer: 59 | Admitting: Adult Health

## 2016-04-18 ENCOUNTER — Ambulatory Visit (INDEPENDENT_AMBULATORY_CARE_PROVIDER_SITE_OTHER): Payer: 59

## 2016-04-18 DIAGNOSIS — D252 Subserosal leiomyoma of uterus: Secondary | ICD-10-CM

## 2016-04-18 DIAGNOSIS — N950 Postmenopausal bleeding: Secondary | ICD-10-CM | POA: Diagnosis not present

## 2016-04-18 DIAGNOSIS — N854 Malposition of uterus: Secondary | ICD-10-CM

## 2016-04-18 NOTE — Progress Notes (Signed)
PELVIC US TA/TV:Heterogeneous anteverted uterus w/ a fundal right subserosal fibroid 3.6 x 3.3 x 3.9 cm,thickened EEC 6.9 mm,normal ov's bilat,no free fluid,no pain during ultrasound,ov's appear to be mobile

## 2016-04-19 ENCOUNTER — Encounter: Payer: Self-pay | Admitting: Adult Health

## 2016-04-19 ENCOUNTER — Telehealth: Payer: Self-pay | Admitting: Adult Health

## 2016-04-19 DIAGNOSIS — D259 Leiomyoma of uterus, unspecified: Secondary | ICD-10-CM

## 2016-04-19 DIAGNOSIS — D219 Benign neoplasm of connective and other soft tissue, unspecified: Secondary | ICD-10-CM

## 2016-04-19 HISTORY — DX: Benign neoplasm of connective and other soft tissue, unspecified: D21.9

## 2016-04-19 NOTE — Telephone Encounter (Signed)
Pt aware that endometrium is thickened 6.9 mm and that has fibroid.Will need endometrial biopsy to assess lining

## 2016-04-22 ENCOUNTER — Encounter: Payer: Self-pay | Admitting: Obstetrics and Gynecology

## 2016-04-22 ENCOUNTER — Ambulatory Visit (INDEPENDENT_AMBULATORY_CARE_PROVIDER_SITE_OTHER): Payer: 59 | Admitting: Obstetrics and Gynecology

## 2016-04-22 VITALS — BP 154/84 | HR 88 | Ht 64.0 in | Wt 142.4 lb

## 2016-04-22 DIAGNOSIS — N938 Other specified abnormal uterine and vaginal bleeding: Secondary | ICD-10-CM

## 2016-04-22 DIAGNOSIS — N882 Stricture and stenosis of cervix uteri: Secondary | ICD-10-CM

## 2016-04-22 DIAGNOSIS — B373 Candidiasis of vulva and vagina: Secondary | ICD-10-CM

## 2016-04-22 NOTE — Progress Notes (Signed)
Kristi Boyd is a 60 y.o. female who presents today for an endometrial biopsy. She was last seen by Anderson Malta on 11/8/7 for postmenopausal bleeding; had an [place] which showed a fibroid. She also states she may have a yeast infection and would like to be evaluated for that today.    Endometrial Biopsy: Patient given informed consent, signed copy in the chart, time out was performed. Time out taken. The patient was placed in the lithotomy position and the cervix brought into view with sterile speculum.  Portion of cervix cleansed x 2 with betadine swabs.  A tenaculum was placed in the anterior lip of the cervix.  Unable to dilate cervix due to cervical stenosis. No dilators available to proceed with procedure today, in prep resterilization from prior use.this am. Will reschedule appointment for when dilators are available.   By signing my name below, I, Sonum Patel, attest that this documentation has been prepared under the direction and in the presence of [name], MD. Electronically Signed: Sonum Patel, Education administrator. 04/22/16. 11:11 AM.  I personally performed the services described in this documentation, which was SCRIBED in my presence. The recorded information has been reviewed and considered accurate. It has been edited as necessary during review. [name], MD

## 2016-05-06 ENCOUNTER — Other Ambulatory Visit: Payer: Self-pay | Admitting: Obstetrics and Gynecology

## 2016-05-06 ENCOUNTER — Encounter: Payer: Self-pay | Admitting: Obstetrics and Gynecology

## 2016-05-06 ENCOUNTER — Ambulatory Visit: Payer: 59 | Admitting: Obstetrics and Gynecology

## 2016-05-06 VITALS — BP 120/70 | HR 71 | Ht 63.0 in | Wt 143.2 lb

## 2016-05-06 DIAGNOSIS — N950 Postmenopausal bleeding: Secondary | ICD-10-CM | POA: Diagnosis not present

## 2016-05-06 DIAGNOSIS — D259 Leiomyoma of uterus, unspecified: Secondary | ICD-10-CM | POA: Diagnosis not present

## 2016-05-06 NOTE — Progress Notes (Signed)
Kristi Boyd is a 60 y.o. female who returns for an endometrial biopsy today. She was had a recent US which showed a fibroid. With some distortion of endometrial stripe.  The bladder appears thickened on TV u/s but after comparison to TA views, it appears to be u/s artifact due to bladder emptying.  Endometrial Biopsy: Patient given informed consent, signed copy in the chart, time out was performed. Time out taken. The patient was placed in the lithotomy position and the cervix brought into view with sterile speculum.  Portio of cervix cleansed x 2 with betadine swabs.  A tenaculum was placed in the anterior lip of the cervix.the cervix required dilation with the small dilators to 4 mm. The uterus was sounded for depth of 7 cm,. Milex uterine Explora 3 mm was introduced to into the uterus, suction created,  and an endometrial sample was obtained. All equipment was removed and accounted for.   The patient tolerated the procedure well.    Patient given post procedure instructions.  Followup: by phone with biopsy report    By signing my name below, I, Sonum Patel, attest that this documentation has been prepared under the direction and in the presence of [name], MD. Electronically Signed: Sonum Patel, Education administrator. 05/06/16. 9:01 AM.  I personally performed the services described in this documentation, which was scribed in my presence. The recorded information has been reviewed and is accurate.

## 2016-05-13 ENCOUNTER — Telehealth: Payer: Self-pay | Admitting: *Deleted

## 2016-05-13 ENCOUNTER — Telehealth: Payer: Self-pay | Admitting: Adult Health

## 2016-05-13 MED ORDER — METRONIDAZOLE 0.75 % VA GEL: 1.0000 | Freq: Two times a day (BID) | VAGINAL | 0 refills | Status: DC

## 2016-05-13 NOTE — Telephone Encounter (Signed)
Pt called stating that she did got a phone call from the nurse today stating that her test were good., but patient states that the nurse did not give her eniughy information on what to do next. Pt states that she would feel more comfortable if Kristi Boyd would explain in to her. Please contact pt

## 2016-05-13 NOTE — Telephone Encounter (Signed)
Pt aware of path results, but still spotting after sex, will rx metrogel, if continues let me know

## 2016-05-13 NOTE — Telephone Encounter (Signed)
Pt informed of benign endometrium results from 05/06/2016.  Pt states what does she need to do now? Informed Dr. Glo Herring not in the office today will route message to him will be back in the office on Monday.

## 2016-05-17 NOTE — Telephone Encounter (Signed)
Pt on metrogel as discussed with [name], NP,  Pt noted dried blood on wiping recently. Pt advised to be rechecked 2 wk after completion of current tx regimen. No further questions from pt.

## 2016-06-25 ENCOUNTER — Other Ambulatory Visit: Payer: Self-pay | Admitting: Obstetrics and Gynecology

## 2016-06-29 NOTE — Telephone Encounter (Signed)
refil activella x 6 mos  Will need recall

## 2016-07-27 ENCOUNTER — Telehealth: Payer: Self-pay | Admitting: Obstetrics and Gynecology

## 2016-07-27 NOTE — Telephone Encounter (Signed)
Pt states [place] and needs her [facility] [place] [facility] [address] in [place], [place] phone # [phone].  Informed pt RX was refilled today from request received from [facility].  I called [facility] and canceled the RX there and called [facility] the [facility] Dr[facility] Grayce Sessions line.  Pt informed.

## 2016-07-28 ENCOUNTER — Telehealth: Payer: Self-pay | Admitting: Women's Health

## 2016-07-28 NOTE — Telephone Encounter (Signed)
[facility] states [facility], [facility] and [facility] where I called it in yesterday but they don't have it in stock and have to order it, they will get in tomorrow, they did not have a phone # for pt so could not call her.  I gave pharmacist pts # and they are going to call her to let her know what is going on.

## 2016-08-04 ENCOUNTER — Encounter: Payer: Self-pay | Admitting: Adult Health

## 2016-08-04 ENCOUNTER — Other Ambulatory Visit: Payer: Self-pay | Admitting: Obstetrics and Gynecology

## 2016-08-04 ENCOUNTER — Ambulatory Visit (INDEPENDENT_AMBULATORY_CARE_PROVIDER_SITE_OTHER): Payer: 59 | Admitting: Adult Health

## 2016-08-04 VITALS — BP 140/80 | HR 92 | Ht 63.0 in | Wt 134.5 lb

## 2016-08-04 DIAGNOSIS — N950 Postmenopausal bleeding: Secondary | ICD-10-CM

## 2016-08-04 NOTE — Progress Notes (Signed)
Subjective:     Patient ID: Kristi Boyd, female   DOB: [birth year], 61 y.o.   MRN: [id number]  HPI Kristi Boyd is a 21 year ole white female in complaining of vaginal bleeding Sunday,none now, she is on HRT.   Review of Systems Vaginal bleeding Sunday, none now Reviewed past medical,surgical, social and family history. Reviewed medications and allergies.     Objective:   Physical Exam BP 140/80 (BP Location: Left Arm, Patient Position: Sitting, Cuff Size: Normal)   Pulse 92   Ht 5\' 3"  (1.6 m)   Wt 134 lb 8 oz (61 kg)   BMI 23.83 kg/m    Skin warm and dry.Pelvic: external genitalia is normal in appearance no lesions, vagina: pale with loss of moisture and rugae,urethra has no lesions or masses noted, cervix:smooth and atrophic, uterus: normal size, shape and contour, non tender, no masses felt, adnexa: no masses or tenderness noted. Bladder is non tender and no masses felt.  Assessment:     1. PMB (postmenopausal bleeding)       Plan:    Return in 1 week for GYN [place]

## 2016-08-15 ENCOUNTER — Other Ambulatory Visit: Payer: 59

## 2016-08-15 ENCOUNTER — Other Ambulatory Visit: Payer: Self-pay | Admitting: Adult Health

## 2016-08-15 DIAGNOSIS — N950 Postmenopausal bleeding: Secondary | ICD-10-CM

## 2016-08-16 ENCOUNTER — Ambulatory Visit (INDEPENDENT_AMBULATORY_CARE_PROVIDER_SITE_OTHER): Payer: 59

## 2016-08-16 ENCOUNTER — Other Ambulatory Visit: Payer: Self-pay | Admitting: Obstetrics and Gynecology

## 2016-08-16 DIAGNOSIS — N854 Malposition of uterus: Secondary | ICD-10-CM

## 2016-08-16 DIAGNOSIS — N950 Postmenopausal bleeding: Secondary | ICD-10-CM

## 2016-08-16 DIAGNOSIS — R938 Abnormal findings on diagnostic imaging of other specified body structures: Secondary | ICD-10-CM

## 2016-08-16 DIAGNOSIS — D259 Leiomyoma of uterus, unspecified: Secondary | ICD-10-CM | POA: Diagnosis not present

## 2016-08-16 NOTE — Progress Notes (Signed)
PELVIC US TA/TV: heterogeneous anteverted uterus,3.5 x 3.1 x 3 cm fundal right fibroid N/C ,thicken EEC 6.9 mm,normal ovaries bilat,ovaries appear mobile,no free fluid

## 2016-08-17 ENCOUNTER — Telehealth: Payer: Self-pay | Admitting: Adult Health

## 2016-08-17 NOTE — Telephone Encounter (Signed)
Pt aware US showed fibroid and thickened endometrium of 6.9 mm is on HRT and had endo bx in December 2017 that was benign, will discuss with Dr Elonda Husky and call back, if needs another bx or not.

## 2016-08-17 NOTE — Telephone Encounter (Signed)
Pt aware that Dr Elonda Husky says no biopsy now, since benign one in December and on HRT

## 2016-08-29 ENCOUNTER — Ambulatory Visit (INDEPENDENT_AMBULATORY_CARE_PROVIDER_SITE_OTHER): Payer: 59 | Admitting: Adult Health

## 2016-08-29 ENCOUNTER — Encounter: Payer: Self-pay | Admitting: Adult Health

## 2016-08-29 ENCOUNTER — Telehealth: Payer: Self-pay | Admitting: Obstetrics and Gynecology

## 2016-08-29 VITALS — BP 140/64 | HR 74 | Ht 63.0 in | Wt 133.5 lb

## 2016-08-29 DIAGNOSIS — E559 Vitamin D deficiency, unspecified: Secondary | ICD-10-CM

## 2016-08-29 DIAGNOSIS — Z01419 Encounter for gynecological examination (general) (routine) without abnormal findings: Secondary | ICD-10-CM | POA: Diagnosis not present

## 2016-08-29 DIAGNOSIS — Z13220 Encounter for screening for lipoid disorders: Secondary | ICD-10-CM

## 2016-08-29 DIAGNOSIS — N816 Rectocele: Secondary | ICD-10-CM

## 2016-08-29 DIAGNOSIS — Z1211 Encounter for screening for malignant neoplasm of colon: Secondary | ICD-10-CM

## 2016-08-29 DIAGNOSIS — Z79890 Hormone replacement therapy (postmenopausal): Secondary | ICD-10-CM

## 2016-08-29 DIAGNOSIS — Z113 Encounter for screening for infections with a predominantly sexual mode of transmission: Secondary | ICD-10-CM

## 2016-08-29 DIAGNOSIS — Z1329 Encounter for screening for other suspected endocrine disorder: Secondary | ICD-10-CM

## 2016-08-29 DIAGNOSIS — Z1212 Encounter for screening for malignant neoplasm of rectum: Secondary | ICD-10-CM

## 2016-08-29 DIAGNOSIS — Z131 Encounter for screening for diabetes mellitus: Secondary | ICD-10-CM

## 2016-08-29 LAB — HEMOCCULT GUIAC POC 1CARD (OFFICE): FECAL OCCULT BLD: NEGATIVE

## 2016-08-29 MED ORDER — ESTRADIOL 1 MG PO TABS: 1.0000 mg | ORAL_TABLET | Freq: Every day | ORAL | 3 refills | Status: DC

## 2016-08-29 MED ORDER — ESTRADIOL-NORETHINDRONE ACET 1-0.5 MG PO TABS: 1.0000 | ORAL_TABLET | Freq: Every day | ORAL | 3 refills | Status: DC

## 2016-08-29 NOTE — Progress Notes (Signed)
Patient ID: Kristi Boyd, female   DOB: Sep 26, 1955, 61 y.o.   MRN: [id number] History of Present Illness: Kristi Boyd is a 60 year old white female in for well woman gyn exam, she had normal pap with negative HPV 08/26/15.No more PMB.Boyfriend coming home in May.She said she broke a toe last week, hit bed.   Current Medications, Allergies, Past Medical History, Past Surgical History, Family History and Social History were reviewed in [employer] record.     Review of Systems:  Patient denies any headaches, hearing loss, fatigue, blurred vision, shortness of breath, chest pain, abdominal pain, problems with bowel movements, urination, or intercourse(not having sex). No joint pain or mood swings. Has not had nay more PMB.  Physical Exam:BP 140/64 (BP Location: Left Arm, Patient Position: Sitting, Cuff Size: Normal)   Pulse 74   Ht 5\' 3"  (1.6 m)   Wt 133 lb 8 oz (60.6 kg)   BMI 23.65 kg/m  General:  Well developed, well nourished, no acute distress Skin:  Warm and dry Neck:  Midline trachea, normal thyroid, good ROM, no lymphadenopathy, no carotid bruits heard Lungs; Clear to auscultation bilaterally Breast:  No dominant palpable mass, retraction, or nipple discharge Cardiovascular: Regular rate and rhythm Abdomen:  Soft, non tender, no hepatosplenomegaly Pelvic:  External genitalia is normal in appearance, no lesions.  The vagina is normal in appearance for age, with loss of color,moisture and rugae. Urethra has no lesions or masses. The cervix is smooth and stenotic at os.  Uterus is felt to be normal size, shape, and contour.  No adnexal masses or tenderness noted.Bladder is non tender, no masses felt. Rectal: Good sphincter tone, no polyps, or hemorrhoids felt.  Hemoccult negative.+rectocele Extremities/musculoskeletal:  No swelling or varicosities noted, no clubbing or cyanosis Psych:  No mood changes, alert and cooperative,seems happy PHQ 2 score 0.  Impression: 1.  Well woman exam with routine gynecological exam   2. Rectocele   3. Screening for colorectal cancer   4. Hormone replacement therapy, postmenopausal   5. Vitamin D deficiency   6. Lipid screening   7. Thyroid disorder screening   8. Screening for diabetes mellitus   9. Screening examination for STD (sexually transmitted disease)       Plan: Meds ordered this encounter  Medications  . estradiol (ESTRACE) 1 MG tablet    Sig: Take 1 tablet (1 mg total) by mouth daily.    Dispense:  90 tablet    Refill:  3    Order Specific Question:   Supervising Provider    Answer:   Elonda Husky, LUTHER H [[id number]]  . estradiol-norethindrone (ACTIVELLA) 1-0.5 MG tablet    Sig: Take 1 tablet by mouth daily.    Dispense:  84 tablet    Refill:  3    Order Specific Question:   Supervising Provider    Answer:   Florian Buff [[id number]]  GC/CHL sent on urine Check CBC,CMP,TSH and lipids,A1c, vitamin D and hepatitis C antibody and HIV and RPR Physical in 1 year Pap in 3 if normal Mammogram yearly

## 2016-08-29 NOTE — Telephone Encounter (Signed)
Kristi Boyd was here to see Kristi Boyd this am and Kristi Boyd told her she would need to talk to you about getting her Valium/ she could not refill this for her. Please call pt and advise

## 2016-08-30 LAB — VITAMIN D 25 HYDROXY (VIT D DEFICIENCY, FRACTURES): VIT D 25 HYDROXY: 47.9 ng/mL (ref 30.0–100.0)

## 2016-08-30 LAB — LIPID PANEL
CHOLESTEROL TOTAL: 236 mg/dL — AB (ref 100–199)
Chol/HDL Ratio: 5.6 ratio units — ABNORMAL HIGH (ref 0.0–4.4)
HDL: 42 mg/dL (ref >39)
LDL CALC: 154 mg/dL — AB (ref 0–99)
Triglycerides: 199 mg/dL — ABNORMAL HIGH (ref 0–149)
VLDL CHOLESTEROL CAL: 40 mg/dL (ref 5–40)

## 2016-08-30 LAB — CBC
HEMATOCRIT: 48 % — AB (ref 34.0–46.6)
HEMOGLOBIN: 15.9 g/dL (ref 11.1–15.9)
MCH: 29.7 pg (ref 26.6–33.0)
MCHC: 33.1 g/dL (ref 31.5–35.7)
MCV: 90 fL (ref 79–97)
Platelets: 307 10*3/uL (ref 150–379)
RBC: 5.35 x10E6/uL — ABNORMAL HIGH (ref 3.77–5.28)
RDW: 14.5 % (ref 12.3–15.4)
WBC: 10.3 10*3/uL (ref 3.4–10.8)

## 2016-08-30 LAB — COMPREHENSIVE METABOLIC PANEL
A/G RATIO: 1.8 (ref 1.2–2.2)
ALBUMIN: 4.6 g/dL (ref 3.6–4.8)
ALK PHOS: 126 IU/L — AB (ref 39–117)
ALT: 12 IU/L (ref 0–32)
AST: 13 IU/L (ref 0–40)
BUN / CREAT RATIO: 10 — AB (ref 12–28)
BUN: 11 mg/dL (ref 8–27)
Bilirubin Total: 0.3 mg/dL (ref 0.0–1.2)
CO2: 23 mmol/L (ref 18–29)
CREATININE: 1.07 mg/dL — AB (ref 0.57–1.00)
Calcium: 9.3 mg/dL (ref 8.7–10.3)
Chloride: 100 mmol/L (ref 96–106)
GFR calc Af Amer: 64 mL/min/{1.73_m2} (ref >59)
GFR calc non Af Amer: 56 mL/min/{1.73_m2} — ABNORMAL LOW (ref >59)
GLOBULIN, TOTAL: 2.6 g/dL (ref 1.5–4.5)
Glucose: 114 mg/dL — ABNORMAL HIGH (ref 65–99)
POTASSIUM: 4.3 mmol/L (ref 3.5–5.2)
SODIUM: 140 mmol/L (ref 134–144)
Total Protein: 7.2 g/dL (ref 6.0–8.5)

## 2016-08-30 LAB — HEPATITIS C ANTIBODY: Hep C Virus Ab: <0.1 s/co ratio (ref 0.0–0.9)

## 2016-08-30 LAB — TSH: TSH: 2.27 u[IU]/mL (ref 0.450–4.500)

## 2016-08-30 LAB — HIV ANTIBODY (ROUTINE TESTING W REFLEX): HIV Screen 4th Generation wRfx: NONREACTIVE

## 2016-08-30 LAB — HEMOGLOBIN A1C
ESTIMATED AVERAGE GLUCOSE: 108 mg/dL
Hgb A1c MFr Bld: 5.4 % (ref 4.8–5.6)

## 2016-08-30 LAB — RPR: RPR: NONREACTIVE

## 2016-08-31 ENCOUNTER — Other Ambulatory Visit: Payer: Self-pay | Admitting: Obstetrics and Gynecology

## 2016-08-31 LAB — GC/CHLAMYDIA PROBE AMP
Chlamydia trachomatis, NAA: NEGATIVE
Neisseria gonorrhoeae by PCR: NEGATIVE

## 2016-08-31 MED ORDER — DIAZEPAM 10 MG PO TABS: 10.0000 mg | ORAL_TABLET | Freq: Every day | ORAL | 2 refills | Status: DC

## 2016-08-31 NOTE — Progress Notes (Signed)
Valium refilled x 90 days. Pt will need to be seen for further discussion if refils sought. There will be need for an appt the patient informed in writing on Rx.

## 2016-09-01 ENCOUNTER — Telehealth: Payer: Self-pay | Admitting: Adult Health

## 2016-09-01 NOTE — Telephone Encounter (Signed)
LMOVM to return call on Monday.

## 2016-09-05 ENCOUNTER — Encounter: Payer: Self-pay | Admitting: Adult Health

## 2016-09-05 ENCOUNTER — Telehealth: Payer: Self-pay | Admitting: Adult Health

## 2016-09-05 DIAGNOSIS — E785 Hyperlipidemia, unspecified: Secondary | ICD-10-CM

## 2016-09-05 HISTORY — DX: Hyperlipidemia, unspecified: E78.5

## 2016-09-05 NOTE — Telephone Encounter (Signed)
JAG spoke with pt regarding labs. [place]

## 2016-09-05 NOTE — Telephone Encounter (Signed)
Pt aware of labs, need to increase water and try red krell oil, and recheck labs in 3 months, CMP and lipids

## 2016-09-12 ENCOUNTER — Encounter: Payer: Self-pay | Admitting: Obstetrics and Gynecology

## 2016-09-12 ENCOUNTER — Ambulatory Visit (INDEPENDENT_AMBULATORY_CARE_PROVIDER_SITE_OTHER): Payer: 59 | Admitting: Obstetrics and Gynecology

## 2016-09-12 VITALS — BP 110/70 | HR 84 | Ht 63.0 in

## 2016-09-12 DIAGNOSIS — F410 Panic disorder [episodic paroxysmal anxiety] without agoraphobia: Secondary | ICD-10-CM

## 2016-09-12 NOTE — Progress Notes (Signed)
Patient ID: [name], female   DOB: 1956-04-12, 61 y.o.   MRN: [id number]   [facility] Visit  @DATE @            Patient name: Kristi Boyd MRN [id number]  Date of birth: 02-Jul-1955  CC & HPI:   Chief Complaint  Patient presents with   discuss meds    Kristi Boyd     Kristi Boyd is a 61 y.o. female presenting today for Kristi Boyd refill. She is taking one a day, but occasionally takes it twice a day. Has been on it almost daily since teen years, has remained stable on it. Is resistant to trying to wean, and as discussed with her before, must NOT increase utilization. Pt acknowledges need to avoid overuse.  ROS:  ROS No complaints   Pertinent History Reviewed:   Reviewed Medical         Past Medical History:  Diagnosis Date   Allergy    Disc degeneration    Dyslipidemia 09/05/2016   Fibroid 04/19/2016   Plantar fasciitis    Ulcer ([place])    Vitamin D deficiency                               Surgical Hx:    Past Surgical History:  Procedure Laterality Date   BACK SURGERY     CATARACT EXTRACTION W/PHACO Right 12/14/2015   Procedure: CATARACT EXTRACTION PHACO AND INTRAOCULAR LENS PLACEMENT ([place]);  Surgeon: Tonny Branch, MD;  Location: [facility];  Service: Ophthalmology;  Laterality: Right;  CDE: 4.70   CATARACT EXTRACTION W/PHACO Left 12/31/2015   Procedure: CATARACT EXTRACTION PHACO AND INTRAOCULAR LENS PLACEMENT LEFT EYE; CDE:6.39;  Surgeon: Tonny Branch, MD;  Location: [facility];  Service: Ophthalmology;  Laterality: Left;   DILATION AND CURETTAGE OF UTERUS     FOOT SURGERY     LEEP  2007   NECK SURGERY     SPINE SURGERY     TUBAL LIGATION     Medications: Reviewed & Updated - see associated section                       Current Outpatient Prescriptions:    cetirizine (ZYRTEC) 10 MG tablet, Take 10 mg by mouth daily as needed for allergies. , Disp: , Rfl:    diazepam (Kristi Boyd) 10 MG tablet, Take 1 tablet (10 mg total) by mouth at bedtime. (Patient taking  differently: Take 10 mg by mouth daily. ), Disp: 30 tablet, Rfl: 2   estradiol (ESTRACE) 1 MG tablet, Take 1 tablet (1 mg total) by mouth daily., Disp: 90 tablet, Rfl: 3   estradiol-norethindrone (ACTIVELLA) 1-0.5 MG tablet, Take 1 tablet by mouth daily., Disp: 84 tablet, Rfl: 3   fluticasone (FLONASE) 50 MCG/ACT nasal spray, Place 2 sprays into both nostrils as needed. , Disp: , Rfl:    ranitidine (ZANTAC) 300 MG tablet, Take 300 mg by mouth as needed. , Disp: , Rfl:    Social History: Reviewed -  reports that she has been smoking Cigarettes.  She has a 16.00 pack-year smoking history. She has never used smokeless tobacco.  Objective Findings:  Vitals: Blood pressure 110/70, pulse 84, height 5\' 3"  (1.6 m).  Physical Examination: Discussion only    Assessment & Plan:   A:  1. Encounter for Kristi Boyd refill; discussed risks of long term use  P:  1. Refill Kristi Boyd 1/2-1 hs of  10 mg dose.    By signing my name below, I, Hansel Feinstein, attest that this documentation has been prepared under the direction and in the presence of [name], MD. Electronically Signed: Hansel Feinstein, ED Scribe. 09/12/16. 12:35 PM.  I personally performed the services described in this documentation, which was SCRIBED in my presence. The recorded information has been reviewed and considered accurate. It has been edited as necessary during review. [name], MD

## 2016-09-19 ENCOUNTER — Other Ambulatory Visit: Payer: Self-pay | Admitting: Obstetrics and Gynecology

## 2016-09-20 NOTE — Telephone Encounter (Signed)
Will route to [place] for her review.

## 2016-12-19 ENCOUNTER — Other Ambulatory Visit: Payer: Self-pay | Admitting: Obstetrics and Gynecology

## 2016-12-19 NOTE — Telephone Encounter (Signed)
refil x 1 of valium. Pt uses 1/month hs

## 2017-01-20 ENCOUNTER — Other Ambulatory Visit: Payer: Self-pay | Admitting: Obstetrics and Gynecology

## 2017-01-26 ENCOUNTER — Telehealth: Payer: Self-pay | Admitting: *Deleted

## 2017-01-26 NOTE — Telephone Encounter (Signed)
Pt is requesting  Refill on Diazepam. Pt has scheduled an appt. [place]

## 2017-01-28 ENCOUNTER — Other Ambulatory Visit: Payer: Self-pay | Admitting: Obstetrics and Gynecology

## 2017-01-28 MED ORDER — DIAZEPAM 10 MG PO TABS: 10.0000 mg | ORAL_TABLET | Freq: Every day | ORAL | 3 refills | Status: DC

## 2017-01-28 NOTE — Progress Notes (Signed)
Refill valium 10 x 30 tabs refil x 3

## 2017-01-28 NOTE — Telephone Encounter (Signed)
Refilled x 4 months, pt needs to know

## 2017-01-30 NOTE — Telephone Encounter (Signed)
Spoke with pt letting her know Dr. Glo Herring ordered Valium but didn't sign prescription. [facility] [place] and gave a verbal for Valium 10 mg # 30 take 1 tab by mouth at bedtime with 3 refills per Dr. Glo Herring. Advised can pick up later today. Pt voiced understanding. [place]

## 2017-02-13 ENCOUNTER — Encounter: Payer: Self-pay | Admitting: Obstetrics and Gynecology

## 2017-02-13 ENCOUNTER — Ambulatory Visit (INDEPENDENT_AMBULATORY_CARE_PROVIDER_SITE_OTHER): Payer: 59 | Admitting: Obstetrics and Gynecology

## 2017-02-13 VITALS — BP 140/80 | HR 60 | Ht 63.39 in | Wt 138.0 lb

## 2017-02-13 DIAGNOSIS — Z760 Encounter for issue of repeat prescription: Secondary | ICD-10-CM | POA: Diagnosis not present

## 2017-02-13 DIAGNOSIS — Z79890 Hormone replacement therapy (postmenopausal): Secondary | ICD-10-CM

## 2017-02-13 MED ORDER — DIAZEPAM 10 MG PO TABS: 10.0000 mg | ORAL_TABLET | Freq: Every day | ORAL | 1 refills | Status: DC

## 2017-02-13 MED ORDER — ESTRADIOL 1 MG PO TABS: 1.0000 mg | ORAL_TABLET | Freq: Every day | ORAL | 3 refills | Status: DC

## 2017-02-13 MED ORDER — ESTRADIOL-NORETHINDRONE ACET 1-0.5 MG PO TABS: 1.0000 | ORAL_TABLET | Freq: Every day | ORAL | 3 refills | Status: DC

## 2017-02-13 NOTE — Progress Notes (Signed)
Patient ID: PINKY RAVAN, female   DOB: July 30, 1955, 61 y.o.   MRN: [id number]   [facility] Visit  @DATE @            Patient name: Kristi Boyd MRN [id number]  Date of birth: [birth year]  CC & HPI:  Kristi Boyd is a 61 y.o. female presenting today for a medication refill.On menopausal management meds She still has her uterus. Therefore she needs a progestin agent  She denies any problems with her current medications. She is not having any withdrawal bleeding. She states she is taking both Activella plus a supplemental estradiol 1 mg tablet because she has hot flashes on the Activella dose by itself  ROS:  ROS  All systems are negative except as noted in the HPI and PMH.   Pertinent History Reviewed:   Reviewed: Significant for vitamin D deficiency Medical         Past Medical History:  Diagnosis Date  . Allergy   . Disc degeneration   . Dyslipidemia 09/05/2016  . Fibroid 04/19/2016  . Plantar fasciitis   . Ulcer   . Vitamin D deficiency                               Surgical Hx:    Past Surgical History:  Procedure Laterality Date  . BACK SURGERY    . CATARACT EXTRACTION W/PHACO Right 12/14/2015   Procedure: CATARACT EXTRACTION PHACO AND INTRAOCULAR LENS PLACEMENT ([facility]);  Surgeon: Tonny Branch, MD;  Location: [facility];  Service: Ophthalmology;  Laterality: Right;  CDE: 4.70  . CATARACT EXTRACTION W/PHACO Left 12/31/2015   Procedure: CATARACT EXTRACTION PHACO AND INTRAOCULAR LENS PLACEMENT LEFT EYE; CDE:6.39;  Surgeon: Tonny Branch, MD;  Location: [facility];  Service: Ophthalmology;  Laterality: Left;  . DILATION AND CURETTAGE OF UTERUS    . FOOT SURGERY    . LEEP  2007  . NECK SURGERY    . SPINE SURGERY    . TUBAL LIGATION     Medications: Reviewed & Updated - see associated section                       Current Outpatient Prescriptions:  .  diazepam (VALIUM) 10 MG tablet, Take 1 tablet (10 mg total) by mouth at bedtime., Disp: 30 tablet, Rfl: 3 .  estradiol (ESTRACE) 1  MG tablet, Take 1 tablet (1 mg total) by mouth daily., Disp: 90 tablet, Rfl: 3 .  fluticasone (FLONASE) 50 MCG/ACT nasal spray, Place 2 sprays into both nostrils as needed. , Disp: , Rfl:  .  cetirizine (ZYRTEC) 10 MG tablet, Take 10 mg by mouth daily as needed for allergies. , Disp: , Rfl:  .  estradiol-norethindrone (ACTIVELLA) 1-0.5 MG tablet, Take 1 tablet by mouth daily. (Patient not taking: Reported on 02/13/2017), Disp: 84 tablet, Rfl: 3 .  ranitidine (ZANTAC) 300 MG tablet, Take 300 mg by mouth as needed. , Disp: , Rfl:    Social History: Reviewed -  reports that she has been smoking Cigarettes.  She has a 16.00 pack-year smoking history. She has never used smokeless tobacco.  Objective Findings:  Vitals: Blood pressure 140/80, pulse 60, height 5' 3.39" (1.61 m), weight 138 lb (62.6 kg).  Physical Examination: General: Animated, alert, oriented x 3, affect appropriate to mood HEET: PERRL, EOM  Assessment & Plan:   A:  1. Med refill 2. Anxiety disorder stable  3. Menopause managementWith continuous estrogen plus progesterone  P:  1. She is to follow up in 6 months for consideration of anxiety medication refill. 2.    By signing my name below, I, Margit Banda, attest that this documentation has been prepared under the direction and in the presence of Kristi Kind, MD. Electronically Signed: Margit Banda, Medical Scribe. 02/13/17. 11:21 AM.  I personally performed the services described in this documentation, which was SCRIBED in my presence. The recorded information has been reviewed and considered accurate. It has been edited as necessary during review. Kristi Kind, MD

## 2017-03-15 ENCOUNTER — Other Ambulatory Visit: Payer: Self-pay | Admitting: *Deleted

## 2017-03-15 ENCOUNTER — Telehealth: Payer: Self-pay | Admitting: Adult Health

## 2017-03-15 MED ORDER — ESTRADIOL 1 MG PO TABS: 1.0000 mg | ORAL_TABLET | Freq: Every day | ORAL | 3 refills | Status: DC

## 2017-03-15 MED ORDER — ESTRADIOL-NORETHINDRONE ACET 1-0.5 MG PO TABS: 1.0000 | ORAL_TABLET | Freq: Every day | ORAL | 3 refills | Status: DC

## 2017-03-15 NOTE — Telephone Encounter (Signed)
[facility] called [facility]. Medication resent.

## 2017-03-15 NOTE — Telephone Encounter (Signed)
Patient called stating that her medication was all messed up from her last visit and needs a nurse or a doctor to call her back as soon as possible. Please contact pt

## 2017-04-03 ENCOUNTER — Encounter: Payer: Self-pay | Admitting: Adult Health

## 2017-04-03 ENCOUNTER — Ambulatory Visit (INDEPENDENT_AMBULATORY_CARE_PROVIDER_SITE_OTHER): Payer: 59 | Admitting: Adult Health

## 2017-04-03 VITALS — BP 174/90 | HR 73 | Ht 63.0 in | Wt 141.5 lb

## 2017-04-03 DIAGNOSIS — Z113 Encounter for screening for infections with a predominantly sexual mode of transmission: Secondary | ICD-10-CM | POA: Diagnosis not present

## 2017-04-03 DIAGNOSIS — N898 Other specified noninflammatory disorders of vagina: Secondary | ICD-10-CM | POA: Diagnosis not present

## 2017-04-03 DIAGNOSIS — N760 Acute vaginitis: Secondary | ICD-10-CM

## 2017-04-03 DIAGNOSIS — B9689 Other specified bacterial agents as the cause of diseases classified elsewhere: Secondary | ICD-10-CM

## 2017-04-03 LAB — POCT WET PREP (WET MOUNT)
Clue Cells Wet Prep Whiff POC: POSITIVE
WBC WET PREP: POSITIVE

## 2017-04-03 MED ORDER — METRONIDAZOLE 500 MG PO TABS: 500.0000 mg | ORAL_TABLET | Freq: Two times a day (BID) | ORAL | 0 refills | Status: DC

## 2017-04-03 NOTE — Patient Instructions (Signed)
Bacterial Vaginosis Bacterial vaginosis is a vaginal infection that occurs when the normal balance of bacteria in the vagina is disrupted. It results from an overgrowth of certain bacteria. This is the most common vaginal infection among women ages 15-44. Because bacterial vaginosis increases your risk for STIs (sexually transmitted infections), getting treated can help reduce your risk for chlamydia, gonorrhea, herpes, and HIV (human immunodeficiency virus). Treatment is also important for preventing complications in pregnant women, because this condition can cause an early (premature) delivery. What are the causes? This condition is caused by an increase in harmful bacteria that are normally present in small amounts in the vagina. However, the reason that the condition develops is not fully understood. What increases the risk? The following factors may make you more likely to develop this condition:  Having a new sexual partner or multiple sexual partners.  Having unprotected sex.  Douching.  Having an intrauterine device (IUD).  Smoking.  Drug and alcohol abuse.  Taking certain antibiotic medicines.  Being pregnant.  You cannot get bacterial vaginosis from toilet seats, bedding, swimming pools, or contact with objects around you. What are the signs or symptoms? Symptoms of this condition include:  Grey or white vaginal discharge. The discharge can also be watery or foamy.  A fish-like odor with discharge, especially after sexual intercourse or during menstruation.  Itching in and around the vagina.  Burning or pain with urination.  Some women with bacterial vaginosis have no signs or symptoms. How is this diagnosed? This condition is diagnosed based on:  Your medical history.  A physical exam of the vagina.  Testing a sample of vaginal fluid under a microscope to look for a large amount of bad bacteria or abnormal cells. Your health care provider may use a cotton swab  or a small wooden spatula to collect the sample.  How is this treated? This condition is treated with antibiotics. These may be given as a pill, a vaginal cream, or a medicine that is put into the vagina (suppository). If the condition comes back after treatment, a second round of antibiotics may be needed. Follow these instructions at home: Medicines  Take over-the-counter and prescription medicines only as told by your health care provider.  Take or use your antibiotic as told by your health care provider. Do not stop taking or using the antibiotic even if you start to feel better. General instructions  If you have a female sexual partner, tell her that you have a vaginal infection. She should see her health care provider and be treated if she has symptoms. If you have a female sexual partner, he does not need treatment.  During treatment: ? Avoid sexual activity until you finish treatment. ? Do not douche. ? Avoid alcohol as directed by your health care provider. ? Avoid breastfeeding as directed by your health care provider.  Drink enough water and fluids to keep your urine clear or pale yellow.  Keep the area around your vagina and rectum clean. ? Wash the area daily with warm water. ? Wipe yourself from front to back after using the toilet.  Keep all follow-up visits as told by your health care provider. This is important. How is this prevented?  Do not douche.  Wash the outside of your vagina with warm water only.  Use protection when having sex. This includes latex condoms and dental dams.  Limit how many sexual partners you have. To help prevent bacterial vaginosis, it is best to have sex with just   one partner (monogamous).  Make sure you and your sexual partner are tested for STIs.  Wear cotton or cotton-lined underwear.  Avoid wearing tight pants and pantyhose, especially during summer.  Limit the amount of alcohol that you drink.  Do not use any products that  contain nicotine or tobacco, such as cigarettes and e-cigarettes. If you need help quitting, ask your health care provider.  Do not use illegal drugs. Where to find more information:  Centers for Disease Control and Prevention: www.cdc.gov/std  American Sexual Health Association (ASHA): www.ashastd.org  U.S. Department of Health and Human Services, Office on Women's Health: www.womenshealth.gov/ or https://www.womenshealth.gov/a-z-topics/bacterial-vaginosis Contact a health care provider if:  Your symptoms do not improve, even after treatment.  You have more discharge or pain when urinating.  You have a fever.  You have pain in your abdomen.  You have pain during sex.  You have vaginal bleeding between periods. Summary  Bacterial vaginosis is a vaginal infection that occurs when the normal balance of bacteria in the vagina is disrupted.  Because bacterial vaginosis increases your risk for STIs (sexually transmitted infections), getting treated can help reduce your risk for chlamydia, gonorrhea, herpes, and HIV (human immunodeficiency virus). Treatment is also important for preventing complications in pregnant women, because the condition can cause an early (premature) delivery.  This condition is treated with antibiotic medicines. These may be given as a pill, a vaginal cream, or a medicine that is put into the vagina (suppository). This information is not intended to replace advice given to you by your health care provider. Make sure you discuss any questions you have with your health care provider. Document Released: 05/23/2005 Document Revised: 02/06/2016 Document Reviewed: 02/06/2016 Elsevier Interactive Patient Education  2017 Elsevier Inc.  

## 2017-04-03 NOTE — Progress Notes (Signed)
Subjective:     Patient ID: Kristi Boyd, female   DOB: [birth year], 61 y.o.   MRN: [id number]  HPI Mercer is a 61 year old white female in complaining of vagina irritation and odor.  Review of Systems Vaginal irritation and odor Reviewed past medical,surgical, social and family history. Reviewed medications and allergies.     Objective:   Physical Exam BP (!) 174/90 (BP Location: Left Arm, Patient Position: Sitting, Cuff Size: Normal)   Pulse 73   Ht 5\' 3"  (1.6 m)   Wt 141 lb 8 oz (64.2 kg)   BMI 25.07 kg/m  Skin warm and dry.Pelvic: external genitalia is normal in appearance no lesions, vagina: white discharge with odor,urethra has no lesions or masses noted, cervix:smooth, uterus: normal size, shape and contour, non tender, no masses felt, adnexa: no masses or tenderness noted. Bladder is non tender and no masses felt. Wet prep: + for clue cells and +WBCs. GC/CHL obtained.     Assessment:     1. BV (bacterial vaginosis)   2. Vaginal odor   3. Vaginal irritation   4. Vaginal discharge   5. Screening examination for STD (sexually transmitted disease)        Plan:     Rx flagyl 500 mg 1 bid x 7 days, no alcohol, review handout on BV    GC/CHL sent  F/U prn

## 2017-04-05 ENCOUNTER — Telehealth: Payer: Self-pay | Admitting: *Deleted

## 2017-04-05 LAB — GC/CHLAMYDIA PROBE AMP
Chlamydia trachomatis, NAA: NEGATIVE
NEISSERIA GONORRHOEAE BY PCR: NEGATIVE

## 2017-04-05 NOTE — Telephone Encounter (Signed)
Informed patient Gc/Chl was negative. Pt verbalized gratitude.

## 2017-06-07 ENCOUNTER — Encounter: Payer: Self-pay | Admitting: Adult Health

## 2017-08-30 ENCOUNTER — Other Ambulatory Visit: Payer: 59 | Admitting: Adult Health

## 2017-09-20 ENCOUNTER — Other Ambulatory Visit (HOSPITAL_COMMUNITY)
Admission: RE | Admit: 2017-09-20 | Discharge: 2017-09-20 | Disposition: A | Discharge status: Home / Self Care | Payer: 59 | Source: Ambulatory Visit | Attending: Obstetrics and Gynecology | Admitting: Obstetrics and Gynecology

## 2017-09-20 ENCOUNTER — Other Ambulatory Visit: Payer: Self-pay

## 2017-09-20 ENCOUNTER — Ambulatory Visit (INDEPENDENT_AMBULATORY_CARE_PROVIDER_SITE_OTHER): Payer: 59 | Admitting: Obstetrics and Gynecology

## 2017-09-20 ENCOUNTER — Encounter: Payer: Self-pay | Admitting: Obstetrics and Gynecology

## 2017-09-20 ENCOUNTER — Other Ambulatory Visit: Payer: Self-pay | Admitting: Obstetrics and Gynecology

## 2017-09-20 ENCOUNTER — Encounter (INDEPENDENT_AMBULATORY_CARE_PROVIDER_SITE_OTHER): Payer: Self-pay

## 2017-09-20 VITALS — BP 148/86 | HR 89 | Ht 63.0 in | Wt 143.6 lb

## 2017-09-20 DIAGNOSIS — Z01419 Encounter for gynecological examination (general) (routine) without abnormal findings: Secondary | ICD-10-CM | POA: Insufficient documentation

## 2017-09-20 DIAGNOSIS — Z01411 Encounter for gynecological examination (general) (routine) with abnormal findings: Secondary | ICD-10-CM

## 2017-09-20 DIAGNOSIS — Z1211 Encounter for screening for malignant neoplasm of colon: Secondary | ICD-10-CM

## 2017-09-20 DIAGNOSIS — F419 Anxiety disorder, unspecified: Secondary | ICD-10-CM | POA: Diagnosis not present

## 2017-09-20 DIAGNOSIS — Z1212 Encounter for screening for malignant neoplasm of rectum: Secondary | ICD-10-CM

## 2017-09-20 LAB — HEMOCCULT GUIAC POC 1CARD (OFFICE): Fecal Occult Blood, POC: NEGATIVE

## 2017-09-20 MED ORDER — DIAZEPAM 10 MG PO TABS: 10.0000 mg | ORAL_TABLET | Freq: Every day | ORAL | 1 refills | Status: DC

## 2017-09-20 NOTE — Progress Notes (Signed)
Patient ID: Kristi Boyd, female   DOB: [birth year], 62 y.o.   MRN: [id number]  Chief Complaint  Patient presents with  . Gynecologic Exam   Assessment:  1. Annual Gyn Exam 2. Anxiety, stable Plan:  1. pap smear done 2. return annually or prn 3. Annual mammogram advised after age 59 4. Refill Rx Valium Subjective:  Kristi Boyd is a 62 y.o. female [id number] who presents for annual exam. No LMP recorded. Patient is postmenopausal. The patient has no complaints today. She is here for her routine annual exam and medication refill. If she does have intercourse, she sees dark blood afterwards. She is doing well overall and denies fever, chills or any other symptoms or complaints at this time.   The following portions of the patient's history were reviewed and updated as appropriate: allergies, current medications, past family history, past medical history, past social history, past surgical history and problem list. Past Medical History:  Diagnosis Date  . Allergy   . Disc degeneration   . Dyslipidemia 09/05/2016  . Fibroid 04/19/2016  . Plantar fasciitis   . Ulcer   . Vitamin D deficiency     Past Surgical History:  Procedure Laterality Date  . BACK SURGERY    . CATARACT EXTRACTION W/PHACO Right 12/14/2015   Procedure: CATARACT EXTRACTION PHACO AND INTRAOCULAR LENS PLACEMENT (IOC);  Surgeon: Tonny Branch, MD;  Location: [facility];  Service: Ophthalmology;  Laterality: Right;  CDE: 4.70  . CATARACT EXTRACTION W/PHACO Left 12/31/2015   Procedure: CATARACT EXTRACTION PHACO AND INTRAOCULAR LENS PLACEMENT LEFT EYE; CDE:6.39;  Surgeon: Tonny Branch, MD;  Location: [facility];  Service: Ophthalmology;  Laterality: Left;  . DILATION AND CURETTAGE OF UTERUS    . FOOT SURGERY    . LEEP  2007  . NECK SURGERY    . SPINE SURGERY    . TUBAL LIGATION       Current Outpatient Medications:  .  cetirizine (ZYRTEC) 10 MG tablet, Take 10 mg by mouth daily as needed for allergies. , Disp: , Rfl:  .  diazepam  (VALIUM) 10 MG tablet, Take 1 tablet (10 mg total) by mouth at bedtime. (Patient taking differently: Take 10 mg by mouth daily. ), Disp: 90 tablet, Rfl: 1 .  estradiol (ESTRACE) 1 MG tablet, Take 1 tablet (1 mg total) by mouth daily., Disp: 90 tablet, Rfl: 3 .  estradiol-norethindrone (ACTIVELLA) 1-0.5 MG tablet, Take 1 tablet by mouth daily., Disp: 84 tablet, Rfl: 3 .  fluticasone (FLONASE) 50 MCG/ACT nasal spray, Place 2 sprays into both nostrils as needed. , Disp: , Rfl:  .  metroNIDAZOLE (FLAGYL) 500 MG tablet, Take 1 tablet (500 mg total) by mouth 2 (two) times daily., Disp: 14 tablet, Rfl: 0 .  ranitidine (ZANTAC) 300 MG tablet, Take 300 mg by mouth as needed. , Disp: , Rfl:   Review of Systems Constitutional: negative Gastrointestinal: negative Genitourinary: negative  Objective:  BP (!) 148/86 (BP Location: Right Arm, Patient Position: Sitting, Cuff Size: Normal)   Pulse 89   Ht 5\' 3"  (1.6 m)   Wt 143 lb 9.6 oz (65.1 kg)   BMI 25.44 kg/m    BMI: Body mass index is 25.44 kg/m.  General Appearance: Alert, appropriate appearance for age. No acute distress HEENT: Grossly normal Neck / Thyroid:  Cardiovascular: RRR; normal S1, S2, no murmur Lungs: CTA bilaterally Back: No CVAT Breast Exam: No masses or nodes.No dimpling, nipple retraction or discharge. Gastrointestinal: Soft, non-tender, no masses or organomegaly Pelvic  Exam: Vulva and vagina appear normal. Bimanual exam reveals normal uterus and adnexa. External genitalia: normal general appearance Vaginal: good support Cervix: normal appearance Uterus: normal single, nontender Rectovaginal: mild rectocele Lymphatic Exam: Non-palpable nodes in neck, clavicular, axillary, or inguinal regions Skin: no rash or abnormalities Neurologic: Normal gait and speech, no tremor  Psychiatric: Alert and oriented, appropriate affect.  Urinalysis:Not done  By signing my name below, I, Margit Banda, attest that this documentation has  been prepared under the direction and in the presence of [name], MD. Electronically Signed: Margit Banda, Medical Scribe. 09/20/17. 3:21 PM.  I personally performed the services described in this documentation, which was SCRIBED in my presence. The recorded information has been reviewed and considered accurate. It has been edited as necessary during review. [name], MD

## 2017-09-22 LAB — CYTOLOGY - PAP
Diagnosis: NEGATIVE
HPV: NOT DETECTED

## 2017-09-25 ENCOUNTER — Telehealth: Payer: Self-pay | Admitting: *Deleted

## 2017-09-25 NOTE — Telephone Encounter (Signed)
LMOVM   Informed patient pap normal and can repeat in 5 years.

## 2017-10-24 ENCOUNTER — Other Ambulatory Visit: Payer: Self-pay | Admitting: Obstetrics and Gynecology

## 2017-10-24 MED ORDER — DIAZEPAM 10 MG PO TABS: 10.0000 mg | ORAL_TABLET | Freq: Every day | ORAL | 1 refills | Status: DC

## 2018-04-02 ENCOUNTER — Telehealth: Payer: Self-pay | Admitting: Obstetrics and Gynecology

## 2018-04-02 ENCOUNTER — Other Ambulatory Visit: Payer: Self-pay | Admitting: Obstetrics and Gynecology

## 2018-04-02 MED ORDER — ESTRADIOL-NORETHINDRONE ACET 1-0.5 MG PO TABS: 1.0000 | ORAL_TABLET | Freq: Every day | ORAL | 3 refills | Status: DC

## 2018-04-02 NOTE — Progress Notes (Signed)
Refil activella

## 2018-04-02 NOTE — Telephone Encounter (Signed)
[facility] called stating that they faxed over a medication for a patient and they have not received it back. Patient is worried that she wont receive her medication on time. Pt needs a 90 days supplies. Please contact pt

## 2018-04-02 NOTE — Telephone Encounter (Signed)
LMOVM

## 2018-04-02 NOTE — Telephone Encounter (Signed)
Needs refill on Activella refill sent to [facility]. No generics and 90 supply.

## 2018-05-24 ENCOUNTER — Ambulatory Visit: Payer: 59 | Admitting: Obstetrics and Gynecology

## 2018-05-24 ENCOUNTER — Encounter: Payer: Self-pay | Admitting: Obstetrics and Gynecology

## 2018-05-24 VITALS — BP 165/84 | HR 96 | Ht 63.0 in | Wt 152.6 lb

## 2018-05-24 DIAGNOSIS — Z79890 Hormone replacement therapy (postmenopausal): Secondary | ICD-10-CM

## 2018-05-24 MED ORDER — DIAZEPAM 10 MG PO TABS: 10.0000 mg | ORAL_TABLET | Freq: Every day | ORAL | 3 refills | Status: DC

## 2018-05-24 NOTE — Progress Notes (Signed)
Patient ID: Kristi Boyd, female   DOB: [birth year], 62 y.o.   MRN: [id number]    [facility] Visit  @DATE @            Patient name: Kristi Boyd MRN [id number]  Date of birth: 1956/04/28  CC & HPI:  Kristi Boyd is a 62 y.o. female presenting today for Is here for medication refill, Valium. Is taking activella taking once a day. BP is elevated today but says bp would be higher if she hadn't taken half a valium this morning. Uses it once a day in the morning. Sometimes she says she takes it at night. Sometimes she splits them in half just to see how she does, since she was running low. Not taking estrace any longer  Says mother who is 70 kicked her out of home. Says mother claimed her step-brothers and is going through  A lot of family stressors  ROS:  ROS +anxious -fever -chills  All systems are negative except as noted in the HPI and PMH.    Pertinent History Reviewed:   Reviewed:  Medical         Past Medical History:  Diagnosis Date  . Allergy   . Disc degeneration   . Dyslipidemia 09/05/2016  . Fibroid 04/19/2016  . Plantar fasciitis   . Ulcer   . Vitamin D deficiency                               Surgical Hx:    Past Surgical History:  Procedure Laterality Date  . BACK SURGERY    . CATARACT EXTRACTION W/PHACO Right 12/14/2015   Procedure: CATARACT EXTRACTION PHACO AND INTRAOCULAR LENS PLACEMENT (IOC);  Surgeon: Tonny Branch, MD;  Location: [facility];  Service: Ophthalmology;  Laterality: Right;  CDE: 4.70  . CATARACT EXTRACTION W/PHACO Left 12/31/2015   Procedure: CATARACT EXTRACTION PHACO AND INTRAOCULAR LENS PLACEMENT LEFT EYE; CDE:6.39;  Surgeon: Tonny Branch, MD;  Location: [facility];  Service: Ophthalmology;  Laterality: Left;  . DILATION AND CURETTAGE OF UTERUS    . FOOT SURGERY    . LEEP  2007  . NECK SURGERY    . SPINE SURGERY    . TUBAL LIGATION     Medications: Reviewed & Updated - see associated section                       Current Outpatient  Medications:  .  diazepam (VALIUM) 10 MG tablet, Take 1 tablet (10 mg total) by mouth at bedtime., Disp: 90 tablet, Rfl: 1 .  estradiol-norethindrone (ACTIVELLA) 1-0.5 MG tablet, Take 1 tablet by mouth daily., Disp: 84 tablet, Rfl: 3 .  fluticasone (FLONASE) 50 MCG/ACT nasal spray, Place 2 sprays into both nostrils as needed. , Disp: , Rfl:  .  estradiol (ESTRACE) 1 MG tablet, Take 1 tablet (1 mg total) by mouth daily. (Patient not taking: Reported on 05/24/2018), Disp: 90 tablet, Rfl: 3 .  ranitidine (ZANTAC) 300 MG tablet, Take 300 mg by mouth as needed. , Disp: , Rfl:    Social History: Reviewed -  reports that she has been smoking cigarettes. She has a 8.00 pack-year smoking history. She has never used smokeless tobacco.  Objective Findings:  Vitals: Blood pressure (!) 165/84, pulse 96, height 5\' 3"  (1.6 m), weight 152 lb 9.6 oz (69.2 kg).  PHYSICAL EXAMINATION General appearance - oriented to person, place, and time  and anxious Mental status - alert, oriented to person, place, and time, normal mood, behavior, speech, dress, motor activity, and thought processes, affect appropriate to mood  PELVIC DEFERRED    Assessment & Plan:   A:  1.  Anxiety disorder  2.  family stressors lifelong  P:  1.  Medication refill for 1 year for prescription valium and Activella.    By signing my name below, I, Kristi Boyd, attest that this documentation has been prepared under the direction and in the presence of [name], MD. Electronically Signed: [place]. 05/24/18. 11:17 AM.  I personally performed the services described in this documentation, which was SCRIBED in my presence. The recorded information has been reviewed and considered accurate. It has been edited as necessary during review. [name], MD

## 2018-05-31 ENCOUNTER — Other Ambulatory Visit: Payer: Self-pay | Admitting: Obstetrics and Gynecology

## 2018-11-21 ENCOUNTER — Other Ambulatory Visit: Payer: Self-pay | Admitting: Obstetrics and Gynecology

## 2018-11-21 ENCOUNTER — Telehealth: Payer: Self-pay | Admitting: Obstetrics and Gynecology

## 2018-11-21 ENCOUNTER — Telehealth: Payer: Self-pay | Admitting: *Deleted

## 2018-11-21 NOTE — Telephone Encounter (Signed)
refil valium x 90 tabs per longstanding insomnia anxiety disorder tx.

## 2018-11-21 NOTE — Telephone Encounter (Signed)
Pt scheduled for tele visit with Dr. Glo Herring to discuss meds.

## 2018-11-21 NOTE — Telephone Encounter (Signed)
Wants to talk about meds and needing some refills and wants to discuss needing possible tele visit to discuss her meds with [facility]

## 2018-11-28 ENCOUNTER — Telehealth: Payer: Self-pay | Admitting: Obstetrics and Gynecology

## 2018-11-28 NOTE — Telephone Encounter (Signed)
Reminded pt that tomorrows visit is a tele visit and no need to come to the office. Pt verbalized understanding.

## 2018-11-29 ENCOUNTER — Other Ambulatory Visit: Payer: Self-pay

## 2018-11-29 ENCOUNTER — Ambulatory Visit (INDEPENDENT_AMBULATORY_CARE_PROVIDER_SITE_OTHER): Payer: PPO | Admitting: Obstetrics and Gynecology

## 2018-11-29 ENCOUNTER — Encounter: Payer: Self-pay | Admitting: Obstetrics and Gynecology

## 2018-11-29 VITALS — Ht 63.0 in | Wt 145.0 lb

## 2018-11-29 DIAGNOSIS — F411 Generalized anxiety disorder: Secondary | ICD-10-CM

## 2018-11-29 DIAGNOSIS — F419 Anxiety disorder, unspecified: Secondary | ICD-10-CM | POA: Diagnosis not present

## 2018-11-29 MED ORDER — DIAZEPAM 10 MG PO TABS: 5.0000 mg | ORAL_TABLET | Freq: Every evening | ORAL | 1 refills | Status: DC | PRN

## 2018-11-29 MED ORDER — ACTIVELLA 1-0.5 MG PO TABS: 1.0000 | ORAL_TABLET | Freq: Every day | ORAL | 3 refills | Status: DC

## 2018-11-29 NOTE — Progress Notes (Signed)
Patient ID: Kristi Boyd, female   DOB: 01/13/1956, 63 y.o.   MRN: [id number]    TELEHEALTH VIRTUAL GYNECOLOGY VISIT ENCOUNTER NOTE  I connected with Kristi Boyd on 11/29/18 at 11:15 AM EDT by telephone at home and verified that I am speaking with the correct person using two identifiers.   I discussed the limitations, risks, security and privacy concerns of performing an evaluation and management service by telephone and the availability of in person appointments. I also discussed with the patient that there may be a patient responsible charge related to this service. The patient expressed understanding and agreed to proceed.   History:  Kristi Boyd is a 63 y.o. G55P1011 female being evaluated today to discuss her meds. She is taking Activella, which she needs a refill for. Her insurance stopped covering [place], but generic brands don't work for her. She is also taking Valium for her anxiety and needs a refill. She takes one Valium per day. Pt denies any abnormal vaginal discharge, bleeding, pelvic pain or other concerns.    SHx Her 45 y.o. mother kicked her out of the house and does not answer her phone calls anymore.      Past Medical History:  Diagnosis Date  . Allergy   . Disc degeneration   . Dyslipidemia 09/05/2016  . Fibroid 04/19/2016  . Plantar fasciitis   . Ulcer   . Vitamin D deficiency    Past Surgical History:  Procedure Laterality Date  . BACK SURGERY    . CATARACT EXTRACTION W/PHACO Right 12/14/2015   Procedure: CATARACT EXTRACTION PHACO AND INTRAOCULAR LENS PLACEMENT (IOC);  Surgeon: Tonny Branch, MD;  Location: [facility];  Service: Ophthalmology;  Laterality: Right;  CDE: 4.70  . CATARACT EXTRACTION W/PHACO Left 12/31/2015   Procedure: CATARACT EXTRACTION PHACO AND INTRAOCULAR LENS PLACEMENT LEFT EYE; CDE:6.39;  Surgeon: Tonny Branch, MD;  Location: [facility];  Service: Ophthalmology;  Laterality: Left;  . DILATION AND CURETTAGE OF UTERUS    . FOOT SURGERY    . LEEP  2007   . NECK SURGERY    . SPINE SURGERY    . TUBAL LIGATION     The following portions of the patient's history were reviewed and updated as appropriate: allergies, current medications, past family history, past medical history, past social history, past surgical history and problem list.   Health Maintenance:  Normal pap and negative HRHPV on 09/20/2017.  Normal mammogram on 08/03/2015.   Review of Systems:  Pertinent items noted in HPI and remainder of comprehensive ROS otherwise negative.  Physical Exam:   General:  Alert, oriented and cooperative.   Mental Status: Normal mood and affect perceived. Normal judgment and thought content.  Physical exam deferred due to nature of the encounter  Labs and Imaging No results found for this or any previous visit (from the past 336 hour(s)). No results found.    Assessment and Plan:     @DIAGMED @      1. Refill Rx Valium  2. Refill Rx Activella   I discussed the assessment and treatment plan with the patient. The patient was provided an opportunity to ask questions and all were answered. The patient agreed with the plan and demonstrated an understanding of the instructions.   The patient was advised to call back or seek an in-person evaluation/go to the ED if the symptoms worsen or if the condition fails to improve as anticipated.  I provided 10 minutes of non-face-to-face time during this encounter.  By  signing my name below, I, [place], attest that this documentation has been prepared under the direction and in the presence of [name], MD. Electronically Signed: [place], Medical Scribe. 11/29/18. 1:02 PM.  I personally performed the services described in this documentation, which was SCRIBED in my presence. The recorded information has been reviewed and considered accurate. It has been edited as necessary during review. [name], MD

## 2018-12-10 ENCOUNTER — Telehealth: Payer: Self-pay | Admitting: *Deleted

## 2018-12-10 NOTE — Telephone Encounter (Signed)
Spoke with pt

## 2018-12-10 NOTE — Telephone Encounter (Addendum)
Pt states Activella and Valium is over $600.00. Insurance is not covering meds. Please advise. Thanks!! [place]

## 2018-12-10 NOTE — Telephone Encounter (Signed)
Patient called stating she is having trouble getting her prescriptions filled. Patient states it is 600$ for 2 prescriptions. [phone]

## 2018-12-11 ENCOUNTER — Telehealth: Payer: Self-pay | Admitting: Obstetrics and Gynecology

## 2018-12-11 NOTE — Telephone Encounter (Signed)
Has a number she needs to give to [facility] or nurse to call [phone]. Please call pt so she does not have to pay so much for her meds

## 2018-12-11 NOTE — Telephone Encounter (Signed)
Left message @ 4:51 pm with the # the pt left. [place]

## 2018-12-12 NOTE — Telephone Encounter (Signed)
I called the # that pt left and was told that the pt needs to call [facility] @ [phone] and ask for Rx options and say she needs a Tier exception and that she can't afford med. I was also given the # of [phone] [employer]. Pt voiced understanding. Pt will let us know if we need to do anything else. [place]

## 2018-12-13 ENCOUNTER — Telehealth: Payer: Self-pay | Admitting: *Deleted

## 2018-12-13 NOTE — Telephone Encounter (Signed)
Kristi Boyd is filling out Prior Auth forms. Pt informed.  Pt has been on Activella at least 10 yrs, originally rx'd by dr Kandis Cocking.

## 2018-12-13 NOTE — Telephone Encounter (Signed)
PA filled out and faxed. [place]

## 2018-12-13 NOTE — Telephone Encounter (Signed)
Patient called stating she needs a piror auth. Please advise

## 2018-12-14 NOTE — Telephone Encounter (Signed)
Praise the [employer]!!!!  [employer]

## 2018-12-14 NOTE — Telephone Encounter (Signed)
Pt states that the PA has been approved.

## 2018-12-17 ENCOUNTER — Telehealth: Payer: Self-pay | Admitting: Obstetrics and Gynecology

## 2018-12-17 NOTE — Telephone Encounter (Signed)
Patient called, would like to speak to Dr. Johnnye Sima nurse regarding her medications, stated she needs it straightened out on her chart.  Michela Pitcher it has ruined her weekend.  [phone]

## 2018-12-17 NOTE — Telephone Encounter (Signed)
Patient states the prescription for the Valium is written as "10mg  daily, take half a tablet or 1 tablet daily as needed". Informed that is how it is written and can take a whole tablet if she needs it.  Patient would like for this to be changed to "take 1 tablet, 10mg  daily" as this 0.5 tablet is not going to work for her and she is going to be out of medication soon.

## 2018-12-18 ENCOUNTER — Telehealth: Payer: Self-pay | Admitting: Obstetrics and Gynecology

## 2018-12-18 ENCOUNTER — Other Ambulatory Visit: Payer: Self-pay | Admitting: Obstetrics and Gynecology

## 2018-12-18 NOTE — Telephone Encounter (Signed)
Spoke with patient this afternoon and she will stay on the Valium at 1/2 to 110 mg tablet at bedtime.  Patient encouraged to try reducing her dose

## 2018-12-18 NOTE — Telephone Encounter (Signed)
Patient called, Rx reviewed.  Pt describes "misreading :' rx, made aware she has 90 pills with one refil, I.e. 6 months tx. Pt is to try 1/2 pill to see if effective, pt made aware of risk in elder patients. And of the need to decrease dosing.. Will discuss again in 6 mos or prn.

## 2019-09-11 ENCOUNTER — Telehealth: Payer: Self-pay | Admitting: *Deleted

## 2019-09-11 ENCOUNTER — Telehealth: Payer: Self-pay | Admitting: Obstetrics and Gynecology

## 2019-09-11 ENCOUNTER — Other Ambulatory Visit: Payer: Self-pay | Admitting: Obstetrics and Gynecology

## 2019-09-11 NOTE — Telephone Encounter (Signed)
Generic for activella Rx approved, will send note to pt to monitor response.

## 2019-09-11 NOTE — Telephone Encounter (Signed)
Pt requesting to speak with a nurse to discuss issues with medications.

## 2019-09-11 NOTE — Telephone Encounter (Signed)
Telephoned patient at home number and patient stated has appointment tomorrow to discuss medication.

## 2019-09-12 ENCOUNTER — Encounter: Payer: Self-pay | Admitting: Obstetrics and Gynecology

## 2019-09-12 ENCOUNTER — Ambulatory Visit: Payer: PPO | Admitting: Obstetrics and Gynecology

## 2019-09-12 ENCOUNTER — Other Ambulatory Visit: Payer: Self-pay

## 2019-09-12 VITALS — BP 153/93 | HR 115 | Wt 160.8 lb

## 2019-09-12 DIAGNOSIS — Z79890 Hormone replacement therapy (postmenopausal): Secondary | ICD-10-CM | POA: Diagnosis not present

## 2019-09-12 DIAGNOSIS — F411 Generalized anxiety disorder: Secondary | ICD-10-CM

## 2019-09-12 MED ORDER — ESTRADIOL-NORETHINDRONE ACET 1-0.5 MG PO TABS: 1.0000 | ORAL_TABLET | Freq: Every day | ORAL | 12 refills | Status: DC

## 2019-09-12 MED ORDER — DIAZEPAM 10 MG PO TABS: 5.0000 mg | ORAL_TABLET | Freq: Every evening | ORAL | 1 refills | Status: DC | PRN

## 2019-09-12 MED ORDER — ESTRADIOL-NORETHINDRONE ACET 0.5-0.1 MG PO TABS: 1.0000 | ORAL_TABLET | Freq: Every day | ORAL | 3 refills | Status: DC

## 2019-09-12 NOTE — Progress Notes (Signed)
PATIENT ID: Kristi Boyd, female     DOB: 1956-03-06, 64 y.o.     MRN: [id number]    [facility] Visit  09/12/19         Patient name: Kristi Boyd MRN [id number]  Date of birth: [birth year]  CC & HPI:  [name] is a 64 y.o. female presenting today for medication refill.   She has been having issues with getting her prescriptions refilled. Pt finds vasomotor sx uncontrolled on generic meds or reduced dosing of  Activella.   She is fully vaccinated against COVID-19.   ROS:  Review of Systems  Constitutional: Negative for diaphoresis, fever, malaise/fatigue and weight loss.  HENT: Negative for congestion and sore throat.   Eyes: Negative for blurred vision and double vision.  Respiratory: Negative for cough and shortness of breath.   Cardiovascular: Negative for chest pain, palpitations and leg swelling.  Gastrointestinal: Negative for constipation, diarrhea, nausea and vomiting.  Genitourinary: Negative for frequency and urgency.  Musculoskeletal: Negative for back pain, falls and myalgias.  Skin: Negative for rash.  Neurological: Negative for dizziness, weakness and headaches.  Psychiatric/Behavioral: Negative for depression. The patient is not nervous/anxious.      Pertinent History Reviewed:  Reviewed: Significant for  Medical         Past Medical History:  Diagnosis Date  . Allergy   . Disc degeneration   . Dyslipidemia 09/05/2016  . Fibroid 04/19/2016  . Plantar fasciitis   . Ulcer   . Vitamin D deficiency                               Surgical Hx:    Past Surgical History:  Procedure Laterality Date  . BACK SURGERY    . CATARACT EXTRACTION W/PHACO Right 12/14/2015   Procedure: CATARACT EXTRACTION PHACO AND INTRAOCULAR LENS PLACEMENT (IOC);  Surgeon: Tonny Branch, MD;  Location: [facility];  Service: Ophthalmology;  Laterality: Right;  CDE: 4.70  . CATARACT EXTRACTION W/PHACO Left 12/31/2015   Procedure: CATARACT EXTRACTION PHACO AND INTRAOCULAR LENS  PLACEMENT LEFT EYE; CDE:6.39;  Surgeon: Tonny Branch, MD;  Location: [facility];  Service: Ophthalmology;  Laterality: Left;  . DILATION AND CURETTAGE OF UTERUS    . FOOT SURGERY    . LEEP  2007  . NECK SURGERY    . SPINE SURGERY    . TUBAL LIGATION     Medications: Reviewed & Updated - see associated section                       Current Outpatient Medications:  .  Acetaminophen (TYLENOL PO), Take by mouth as needed., Disp: , Rfl:  .  diazepam (VALIUM) 10 MG tablet, Take 0.5-1 tablets (5-10 mg total) by mouth at bedtime as needed., Disp: 90 tablet, Rfl: 1 .  Estradiol-Norethindrone Acet 0.5-0.1 MG tablet, Take 1 tablet by mouth daily., Disp: 90 tablet, Rfl: 3 .  Famotidine (PEPCID PO), Take by mouth as needed., Disp: , Rfl:  .  fluticasone (FLONASE) 50 MCG/ACT nasal spray, Place 2 sprays into both nostrils as needed. , Disp: , Rfl:  .  ibuprofen (ADVIL) 200 MG tablet, Take 200 mg by mouth as needed., Disp: , Rfl:    Social History: Reviewed -  reports that she has been smoking cigarettes. She has a 8.00 pack-year smoking history. She has never used smokeless tobacco.  Objective Findings:  Vitals: Blood  pressure (!) 153/93, pulse (!) 115, weight 160 lb 12.8 oz (72.9 kg).  PHYSICAL EXAMINATION General appearance - alert, well appearing, and in no distress, oriented to person, place, and time, normal appearing weight and well hydrated Mental status - alert, oriented to person, place, and time, normal mood, behavior, speech, dress, motor activity, and thought processes, affect appropriate to mood Chest - not examined Heart - not examined Abdomen - not examined Breasts - not examined Skin - normal coloration and turgor, no rashes, no suspicious skin lesions noted   Assessment & Plan:   A:  1.  Vasomotor sx of menopause not controlled by generics or reduced dosing of Activella. 2. Chronic Benzodiazepine use for insomnia / anxiety, stable.  P:  1.  Rx  Activella ( NETA 0.50 /   2. Continue prior valium use once daily.   By signing my name below, I, [employer], attest that this documentation has been prepared under the direction and in the presence of [name], MD. Electronically Signed: [place]. 09/12/19. 2:24 PM.  I personally performed the services described in this documentation, which was SCRIBED in my presence. The recorded information has been reviewed and considered accurate. It has been edited as necessary during review. [name], MD

## 2019-10-28 DIAGNOSIS — L820 Inflamed seborrheic keratosis: Secondary | ICD-10-CM | POA: Diagnosis not present

## 2020-06-08 ENCOUNTER — Telehealth: Payer: Self-pay

## 2020-06-08 MED ORDER — ESTRADIOL 1 MG PO TABS: 1.0000 mg | ORAL_TABLET | Freq: Every day | ORAL | 6 refills | Status: DC

## 2020-06-08 MED ORDER — PROGESTERONE 200 MG PO CAPS: ORAL_CAPSULE | ORAL | 6 refills | Status: DC

## 2020-06-08 NOTE — Telephone Encounter (Signed)
Pt needs a refill on Activella sent to the pharmacy, pt requesting a call on Rx is sent.

## 2020-06-08 NOTE — Telephone Encounter (Signed)
Can you write something in place of Activella? Activella is on back order and the pharmacy don't know when it will be available. Can you order something in place of it? Pt states [facility] don't help. Please advise. Thanks!! JSY

## 2020-06-08 NOTE — Telephone Encounter (Signed)
Will try estrace 1 mg and 200 mg Prometrium at hs

## 2020-06-30 ENCOUNTER — Other Ambulatory Visit: Payer: Self-pay | Admitting: Adult Health

## 2020-09-14 ENCOUNTER — Ambulatory Visit: Payer: PPO | Admitting: Adult Health

## 2020-12-08 ENCOUNTER — Encounter: Payer: Self-pay | Admitting: Adult Health

## 2020-12-08 ENCOUNTER — Other Ambulatory Visit: Payer: Self-pay

## 2020-12-08 ENCOUNTER — Ambulatory Visit: Payer: PPO | Admitting: Adult Health

## 2020-12-08 VITALS — BP 194/89 | HR 63 | Ht 63.0 in | Wt 147.5 lb

## 2020-12-08 DIAGNOSIS — Z79890 Hormone replacement therapy (postmenopausal): Secondary | ICD-10-CM

## 2020-12-08 DIAGNOSIS — Z1231 Encounter for screening mammogram for malignant neoplasm of breast: Secondary | ICD-10-CM | POA: Insufficient documentation

## 2020-12-08 MED ORDER — PROGESTERONE 200 MG PO CAPS: ORAL_CAPSULE | ORAL | 3 refills | Status: DC

## 2020-12-08 MED ORDER — ESTRADIOL 1 MG PO TABS: 1.0000 mg | ORAL_TABLET | Freq: Every day | ORAL | 3 refills | Status: DC

## 2020-12-08 NOTE — Progress Notes (Signed)
  Subjective:     Patient ID: Kristi Boyd, female   DOB: 1956/01/17, 65 y.o.   MRN: [id number]  HPI Kristi Boyd is a 65 year old white female,divorced, PM in for refills on estrace and Prometrium. She had almost stopped smoking and started back, when having painting and floors redone at her home, had trouble with Curator. Last 4 paps normal. She ask about refilling Valium, I will not refill, needs to see MD or PCP.  Lab Results  Component Value Date   DIAGPAP  09/20/2017    NEGATIVE FOR INTRAEPITHELIAL LESIONS OR MALIGNANCY.   HPV NOT DETECTED 09/20/2017     Review of Systems Feels better on estrace and Prometrium, calmer Reviewed past medical,surgical, social and family history. Reviewed medications and allergies.     Objective:   Physical Exam BP (!) 194/89 (BP Location: Left Arm, Patient Position: Sitting, Cuff Size: Normal)   Pulse 63   Ht 5\' 3"  (1.6 m)   Wt 147 lb 8 oz (66.9 kg)   BMI 26.13 kg/m  Skin warm and dry. Neck: mid line trachea, normal thyroid, good ROM, no lymphadenopathy noted. Lungs: clear to ausculation bilaterally. Cardiovascular: regular rate and rhythm. No carotid bruits heard  Fall risk is low  [facility] - 12/08/20 1121       Pregnancy Intention Screening   Does the patient want to become pregnant in the next year? No    Does the patient's partner want to become pregnant in the next year? No    Would the patient like to discuss contraceptive options today? No      Contraception Wrap Up   Current Method Female Sterilization    End Method Female Sterilization    Contraception Counseling Provided No                Assessment:     1. Hormone replacement therapy, postmenopausal Will refill estrace and Prometrium Meds ordered this encounter  Medications   estradiol (ESTRACE) 1 MG tablet    Sig: Take 1 tablet (1 mg total) by mouth daily.    Dispense:  90 tablet    Refill:  3    Order Specific Question:   Supervising Provider    Answer:   Tania Ade  H [[id number]]   progesterone (PROMETRIUM) 200 MG capsule    Sig: Take 1 daily at bedtime    Dispense:  90 capsule    Refill:  3    Order Specific Question:   Supervising Provider    Answer:   Elonda Husky, LUTHER H [[id number]]     2. Screening mammogram for breast cancer Scheduled mammogram for her 12/14/20 at [facility] 12/14/20 at 1:45 pm     Plan:     Physical in 1 year

## 2020-12-14 ENCOUNTER — Inpatient Hospital Stay (HOSPITAL_COMMUNITY): Admission: RE | Admit: 2020-12-14 | Payer: PPO | Source: Ambulatory Visit

## 2020-12-18 ENCOUNTER — Other Ambulatory Visit: Payer: Self-pay

## 2020-12-18 ENCOUNTER — Ambulatory Visit (HOSPITAL_COMMUNITY)
Admission: RE | Admit: 2020-12-18 | Discharge: 2020-12-18 | Disposition: A | Discharge status: Home / Self Care | Payer: PPO | Source: Ambulatory Visit | Attending: Adult Health | Admitting: Adult Health

## 2020-12-18 DIAGNOSIS — Z1231 Encounter for screening mammogram for malignant neoplasm of breast: Secondary | ICD-10-CM | POA: Diagnosis not present

## 2021-03-11 DIAGNOSIS — Z23 Encounter for immunization: Secondary | ICD-10-CM | POA: Diagnosis not present

## 2021-04-12 DIAGNOSIS — B070 Plantar wart: Secondary | ICD-10-CM | POA: Diagnosis not present

## 2021-04-12 DIAGNOSIS — C4441 Basal cell carcinoma of skin of scalp and neck: Secondary | ICD-10-CM | POA: Diagnosis not present

## 2021-04-12 DIAGNOSIS — L728 Other follicular cysts of the skin and subcutaneous tissue: Secondary | ICD-10-CM | POA: Diagnosis not present

## 2021-04-12 DIAGNOSIS — L218 Other seborrheic dermatitis: Secondary | ICD-10-CM | POA: Diagnosis not present

## 2021-12-06 ENCOUNTER — Other Ambulatory Visit (HOSPITAL_COMMUNITY)
Admission: RE | Admit: 2021-12-06 | Discharge: 2021-12-06 | Disposition: A | Discharge status: Home / Self Care | Payer: PPO | Source: Ambulatory Visit | Attending: Obstetrics & Gynecology | Admitting: Obstetrics & Gynecology

## 2021-12-06 ENCOUNTER — Encounter: Payer: Self-pay | Admitting: Obstetrics & Gynecology

## 2021-12-06 ENCOUNTER — Ambulatory Visit (INDEPENDENT_AMBULATORY_CARE_PROVIDER_SITE_OTHER): Payer: PPO | Admitting: Obstetrics & Gynecology

## 2021-12-06 VITALS — BP 195/91 | HR 76 | Ht 63.0 in | Wt 145.5 lb

## 2021-12-06 DIAGNOSIS — Z01419 Encounter for gynecological examination (general) (routine) without abnormal findings: Secondary | ICD-10-CM | POA: Insufficient documentation

## 2021-12-06 DIAGNOSIS — Z1151 Encounter for screening for human papillomavirus (HPV): Secondary | ICD-10-CM | POA: Diagnosis not present

## 2021-12-06 DIAGNOSIS — Z1212 Encounter for screening for malignant neoplasm of rectum: Secondary | ICD-10-CM

## 2021-12-06 DIAGNOSIS — Z1211 Encounter for screening for malignant neoplasm of colon: Secondary | ICD-10-CM

## 2021-12-06 DIAGNOSIS — Z1231 Encounter for screening mammogram for malignant neoplasm of breast: Secondary | ICD-10-CM | POA: Diagnosis not present

## 2021-12-06 LAB — HEMOCCULT GUIAC POC 1CARD (OFFICE): Fecal Occult Blood, POC: NEGATIVE

## 2021-12-06 MED ORDER — PROGESTERONE 200 MG PO CAPS: ORAL_CAPSULE | ORAL | 3 refills | Status: DC

## 2021-12-06 MED ORDER — ESTRADIOL 1 MG PO TABS: 1.0000 mg | ORAL_TABLET | Freq: Every day | ORAL | 3 refills | Status: DC

## 2021-12-06 MED ORDER — DIAZEPAM 10 MG PO TABS: 5.0000 mg | ORAL_TABLET | Freq: Every evening | ORAL | 1 refills | Status: DC | PRN

## 2021-12-06 NOTE — Progress Notes (Signed)
Subjective:     Kristi Boyd is a 66 y.o. female here for a routine exam.  No LMP recorded. Patient is postmenopausal. [id number] Birth Control Method:  PMP Menstrual Calendar(currently): amenorrhea  Current complaints: none.   Current acute medical issues:  none   Recent Gynecologic History No LMP recorded. Patient is postmenopausal. Last Pap: 2019,  normal Last mammogram: 2022,  normal  Past Medical History:  Diagnosis Date   Allergy    Disc degeneration    Dyslipidemia 09/05/2016   Fibroid 04/19/2016   Plantar fasciitis    Ulcer    Vitamin D deficiency     Past Surgical History:  Procedure Laterality Date   BACK SURGERY     CATARACT EXTRACTION W/PHACO Right 12/14/2015   Procedure: CATARACT EXTRACTION PHACO AND INTRAOCULAR LENS PLACEMENT ([place]);  Surgeon: Tonny Branch, MD;  Location: [facility];  Service: Ophthalmology;  Laterality: Right;  CDE: 4.70   CATARACT EXTRACTION W/PHACO Left 12/31/2015   Procedure: CATARACT EXTRACTION PHACO AND INTRAOCULAR LENS PLACEMENT LEFT EYE; CDE:6.39;  Surgeon: Tonny Branch, MD;  Location: [facility];  Service: Ophthalmology;  Laterality: Left;   DILATION AND CURETTAGE OF UTERUS     FOOT SURGERY     LEEP  2007   NECK SURGERY     SPINE SURGERY     TUBAL LIGATION      OB History     Gravida  2   Para  1   Term  1   Preterm      AB  1   Living  1      SAB  1   IAB      Ectopic      Multiple      Live Births  1           Social History   Socioeconomic History   Marital status: Divorced    Spouse name: Not on file   Number of children: Not on file   Years of education: Not on file   Highest education level: Not on file  Occupational History   Not on file  Tobacco Use   Smoking status: Every Day    Packs/day: 0.25    Years: 32.00    Total pack years: 8.00    Types: Cigarettes   Smokeless tobacco: Never  Vaping Use   Vaping Use: Never used  Substance and Sexual Activity   Alcohol use: No   Drug use: No   Sexual  activity: Not Currently    Birth control/protection: Post-menopausal, Surgical    Comment: tubal  Other Topics Concern   Not on file  Social History Narrative   Not on file   Social Determinants of Health   Financial Resource Strain: Low Risk  (12/06/2021)   Overall Financial Resource Strain (CARDIA)    Difficulty of Paying Living Expenses: Not hard at all  Food Insecurity: No Food Insecurity (12/06/2021)   Hunger Vital Sign    Worried About Running Out of Food in the Last Year: Never true    Ran Out of Food in the Last Year: Never true  Transportation Needs: No Transportation Needs (12/06/2021)   PRAPARE - Hydrologist (Medical): No    Lack of Transportation (Non-Medical): No  Physical Activity: Sufficiently Active (12/06/2021)   Exercise Vital Sign    Days of Exercise per Week: 7 days    Minutes of Exercise per Session: 60 min  Stress: Stress Concern Present (12/06/2021)  [place] Questionnaire    Feeling of Stress : Rather much  Social Connections: Socially Isolated (12/06/2021)   Social Connection and Isolation Panel [NHANES]    Frequency of Communication with Friends and Family: Once a week    Frequency of Social Gatherings with Friends and Family: Once a week    Attends Religious Services: More than 4 times per year    Active Member of [employer] or Organizations: No    Attends Archivist Meetings: Never    Marital Status: Divorced    Family History  Problem Relation Age of Onset   Thyroid disease Mother    Cancer Father        pancreatic    Heart attack Paternal Grandfather    Diabetes Paternal Grandfather      Current Outpatient Medications:    Acetaminophen (TYLENOL PO), Take by mouth as needed., Disp: , Rfl:    Cetirizine HCl (ZYRTEC PO), Take by mouth., Disp: , Rfl:    Famotidine (PEPCID PO), Take by mouth as needed., Disp: , Rfl:    ibuprofen (ADVIL) 200 MG tablet, Take 200 mg  by mouth as needed., Disp: , Rfl:    diazepam (VALIUM) 10 MG tablet, Take 0.5-1 tablets (5-10 mg total) by mouth at bedtime as needed., Disp: 90 tablet, Rfl: 1   estradiol (ESTRACE) 1 MG tablet, Take 1 tablet (1 mg total) by mouth daily., Disp: 90 tablet, Rfl: 3   progesterone (PROMETRIUM) 200 MG capsule, Take 1 daily at bedtime, Disp: 90 capsule, Rfl: 3  Review of Systems  Review of Systems  Constitutional: Negative for fever, chills, weight loss, malaise/fatigue and diaphoresis.  HENT: Negative for hearing loss, ear pain, nosebleeds, congestion, sore throat, neck pain, tinnitus and ear discharge.   Eyes: Negative for blurred vision, double vision, photophobia, pain, discharge and redness.  Respiratory: Negative for cough, hemoptysis, sputum production, shortness of breath, wheezing and stridor.   Cardiovascular: Negative for chest pain, palpitations, orthopnea, claudication, leg swelling and PND.  Gastrointestinal: negative for abdominal pain. Negative for heartburn, nausea, vomiting, diarrhea, constipation, blood in stool and melena.  Genitourinary: Negative for dysuria, urgency, frequency, hematuria and flank pain.  Musculoskeletal: Negative for myalgias, back pain, joint pain and falls.  Skin: Negative for itching and rash.  Neurological: Negative for dizziness, tingling, tremors, sensory change, speech change, focal weakness, seizures, loss of consciousness, weakness and headaches.  Endo/Heme/Allergies: Negative for environmental allergies and polydipsia. Does not bruise/bleed easily.  Psychiatric/Behavioral: Negative for depression, suicidal ideas, hallucinations, memory loss and substance abuse. The patient is not nervous/anxious and does not have insomnia.        Objective:  Blood pressure (!) 195/91, pulse 76, height '5\' 3"'$  (1.6 m), weight 145 lb 8 oz (66 kg).   Physical Exam  Vitals reviewed. Constitutional: She is oriented to person, place, and time. She appears well-developed  and well-nourished.  HENT:  Head: Normocephalic and atraumatic.        Right Ear: External ear normal.  Left Ear: External ear normal.  Nose: Nose normal.  Mouth/Throat: Oropharynx is clear and moist.  Eyes: Conjunctivae and EOM are normal. Pupils are equal, round, and reactive to light. Right eye exhibits no discharge. Left eye exhibits no discharge. No scleral icterus.  Neck: Normal range of motion. Neck supple. No tracheal deviation present. No thyromegaly present.  Cardiovascular: Normal rate, regular rhythm, normal heart sounds and intact distal pulses.  Exam reveals no gallop and no friction rub.  No murmur heard. Respiratory: Effort normal and breath sounds normal. No respiratory distress. She has no wheezes. She has no rales. She exhibits no tenderness.  GI: Soft. Bowel sounds are normal. She exhibits no distension and no mass. There is no tenderness. There is no rebound and no guarding.  Genitourinary:  Breasts no masses skin changes or nipple changes bilaterally      Vulva is normal without lesions Vagina is pink moist without discharge Cervix normal in appearance and pap is done Uterus is normal size shape and contour Adnexa is negative with normal sized ovaries  {Rectal    hemoccult negative, normal tone, no masses  Musculoskeletal: Normal range of motion. She exhibits no edema and no tenderness.  Neurological: She is alert and oriented to person, place, and time. She has normal reflexes. She displays normal reflexes. No cranial nerve deficit. She exhibits normal muscle tone. Coordination normal.  Skin: Skin is warm and dry. No rash noted. No erythema. No pallor.  Psychiatric: She has a normal mood and affect. Her behavior is normal. Judgment and thought content normal.       Medications Ordered at today's visit: Meds ordered this encounter  Medications   estradiol (ESTRACE) 1 MG tablet    Sig: Take 1 tablet (1 mg total) by mouth daily.    Dispense:  90 tablet     Refill:  3   progesterone (PROMETRIUM) 200 MG capsule    Sig: Take 1 daily at bedtime    Dispense:  90 capsule    Refill:  3   diazepam (VALIUM) 10 MG tablet    Sig: Take 0.5-1 tablets (5-10 mg total) by mouth at bedtime as needed.    Dispense:  90 tablet    Refill:  1    This request is for a new prescription for a controlled substance as required by Federal/State law..    Other orders placed at today's visit: Orders Placed This Encounter  Procedures   MM 3D SCREEN BREAST BILATERAL   POCT occult blood stool      Assessment:    Normal Gyn exam.      ICD-10-CM   1. Well woman exam with routine gynecological exam  Z01.419     2. Encounter for gynecological examination with Papanicolaou smear of cervix  Z01.419 Cytology - PAP( [place])    3. Screening for colorectal cancer  Z12.11 POCT occult blood stool   Z12.12     4. Breast cancer screening by mammogram  Z12.31 MM 3D SCREEN BREAST BILATERAL    MM 3D SCREEN BREAST BILATERAL      Plan:    Hormone replacement therapy: hormone replacement therapy: estrace/prometrium. Mammogram ordered. Follow up in: 3 years. Refilled Rx      Return in about 3 years (around 12/06/2024) for yearly.

## 2021-12-09 LAB — CYTOLOGY - PAP
Comment: NEGATIVE
Diagnosis: UNDETERMINED — AB
High risk HPV: NEGATIVE

## 2022-05-25 ENCOUNTER — Other Ambulatory Visit: Payer: Self-pay | Admitting: Obstetrics & Gynecology

## 2022-12-22 ENCOUNTER — Encounter: Payer: Self-pay | Admitting: Obstetrics & Gynecology

## 2022-12-22 ENCOUNTER — Ambulatory Visit: Payer: PPO | Admitting: Obstetrics & Gynecology

## 2022-12-22 VITALS — BP 210/92 | HR 76 | Ht 63.0 in | Wt 152.8 lb

## 2022-12-22 DIAGNOSIS — I10 Essential (primary) hypertension: Secondary | ICD-10-CM

## 2022-12-22 DIAGNOSIS — N951 Menopausal and female climacteric states: Secondary | ICD-10-CM

## 2022-12-22 MED ORDER — VALSARTAN-HYDROCHLOROTHIAZIDE 80-12.5 MG PO TABS: 1.0000 | ORAL_TABLET | Freq: Every day | ORAL | 3 refills | Status: DC

## 2022-12-22 MED ORDER — ESTRADIOL 1 MG PO TABS: 1.0000 mg | ORAL_TABLET | Freq: Every day | ORAL | 3 refills | Status: DC

## 2022-12-22 MED ORDER — DIAZEPAM 10 MG PO TABS: 5.0000 mg | ORAL_TABLET | Freq: Every evening | ORAL | 1 refills | Status: DC | PRN

## 2022-12-22 MED ORDER — PROGESTERONE 200 MG PO CAPS: ORAL_CAPSULE | ORAL | 3 refills | Status: DC

## 2022-12-22 NOTE — Progress Notes (Signed)
Follow up appointment for results: HRT  Chief Complaint  Patient presents with   Medication Refill    Blood pressure (!) 210/92, pulse 76, height 5\' 3"  (1.6 m), weight 152 lb 12.8 oz (69.3 kg).  Pt is doing well on her HRT-->estrace 1 mg + prometrium 200 mg daily Had 3 episodes of spots when she wiped which would not be unexpected, dark brown to black Her vasomotor symptoms are well controlled  Continue on that  MEDS ordered this encounter: Meds ordered this encounter  Medications   estradiol (ESTRACE) 1 MG tablet    Sig: Take 1 tablet (1 mg total) by mouth daily.    Dispense:  90 tablet    Refill:  3   progesterone (PROMETRIUM) 200 MG capsule    Sig: Take 1 daily at bedtime    Dispense:  90 capsule    Refill:  3   valsartan-hydrochlorothiazide (DIOVAN HCT) 80-12.5 MG tablet    Sig: Take 1 tablet by mouth daily.    Dispense:  90 tablet    Refill:  3   diazepam (VALIUM) 10 MG tablet    Sig: Take 0.5-1 tablets (5-10 mg total) by mouth at bedtime as needed.    Dispense:  90 tablet    Refill:  1    Orders for this encounter: Orders Placed This Encounter  Procedures   Ambulatory referral to Family Practice    Impression + Management Plan   ICD-10-CM   1. Vasomotor symptoms due to menopause  N95.1     2. Essential hypertension  I10 Ambulatory referral to Family Practice   begin diovan 80/12.5 today, referral to [facility] for primary Care      Follow Up: Return in about 1 year (around 12/22/2023) for Follow up, with Dr Despina Hidden.     All questions were answered.  Past Medical History:  Diagnosis Date   Allergy    Disc degeneration    Dyslipidemia 09/05/2016   Fibroid 04/19/2016   Plantar fasciitis    Ulcer    Vitamin D deficiency     Past Surgical History:  Procedure Laterality Date   BACK SURGERY     CATARACT EXTRACTION W/PHACO Right 12/14/2015   Procedure: CATARACT EXTRACTION PHACO AND INTRAOCULAR LENS PLACEMENT ([facility]);  Surgeon: Gemma Payor, MD;  Location: [facility];  Service: Ophthalmology;  Laterality: Right;  CDE: 4.70   CATARACT EXTRACTION W/PHACO Left 12/31/2015   Procedure: CATARACT EXTRACTION PHACO AND INTRAOCULAR LENS PLACEMENT LEFT EYE; CDE:6.39;  Surgeon: Gemma Payor, MD;  Location: [facility];  Service: Ophthalmology;  Laterality: Left;   DILATION AND CURETTAGE OF UTERUS     FOOT SURGERY     LEEP  2007   NECK SURGERY     SPINE SURGERY     TUBAL LIGATION      OB History     Gravida  2   Para  1   Term  1   Preterm      AB  1   Living  1      SAB  1   IAB      Ectopic      Multiple      Live Births  1           Allergies  Allergen Reactions   Escitalopram Oxalate     REACTION: "felt high for 2 days"; can't take any anti-depressants   Nsaids     REACTION: told not to take because of history of ulcers  Paroxetine     REACTION: shortness of breath; Can't take any anti-depressants.   Prednisone Other (See Comments)    Aggressive behavior   Sertraline Hcl     REACTION: aggressive behavior   Sulfonamide Derivatives     REACTION: Rash, Fever, pass out    Social History   Socioeconomic History   Marital status: Divorced    Spouse name: Not on file   Number of children: Not on file   Years of education: Not on file   Highest education level: Not on file  Occupational History   Not on file  Tobacco Use   Smoking status: Every Day    Current packs/day: 0.25    Average packs/day: 0.3 packs/day for 32.0 years (8.0 ttl pk-yrs)    Types: Cigarettes   Smokeless tobacco: Never  Vaping Use   Vaping status: Never Used  Substance and Sexual Activity   Alcohol use: No   Drug use: No   Sexual activity: Not Currently    Birth control/protection: Post-menopausal, Surgical    Comment: tubal  Other Topics Concern   Not on file  Social History Narrative   Not on file   Social Determinants of Health   Financial Resource Strain: Low Risk  (12/06/2021)   Overall Financial Resource Strain  (CARDIA)    Difficulty of Paying Living Expenses: Not hard at all  Food Insecurity: No Food Insecurity (12/06/2021)   Hunger Vital Sign    Worried About Running Out of Food in the Last Year: Never true    Ran Out of Food in the Last Year: Never true  Transportation Needs: No Transportation Needs (12/06/2021)   PRAPARE - Administrator, Civil Service (Medical): No    Lack of Transportation (Non-Medical): No  Physical Activity: Sufficiently Active (12/06/2021)   Exercise Vital Sign    Days of Exercise per Week: 7 days    Minutes of Exercise per Session: 60 min  Stress: Stress Concern Present (12/06/2021)   [employer] of Occupational Health - Occupational Stress Questionnaire    Feeling of Stress : Rather much  Social Connections: Socially Isolated (12/06/2021)   Social Connection and Isolation Panel [NHANES]    Frequency of Communication with Friends and Family: Once a week    Frequency of Social Gatherings with Friends and Family: Once a week    Attends Religious Services: More than 4 times per year    Active Member of [employer] or Organizations: No    Attends Banker Meetings: Never    Marital Status: Divorced    Family History  Problem Relation Age of Onset   Thyroid disease Mother    Cancer Father        pancreatic    Heart attack Paternal Grandfather    Diabetes Paternal Actor

## 2023-01-16 ENCOUNTER — Ambulatory Visit: Payer: PPO | Admitting: Family Medicine

## 2023-01-16 VITALS — BP 155/81 | HR 73 | Temp 98.1°F | Ht 63.0 in | Wt 155.2 lb

## 2023-01-16 DIAGNOSIS — K219 Gastro-esophageal reflux disease without esophagitis: Secondary | ICD-10-CM

## 2023-01-16 DIAGNOSIS — E559 Vitamin D deficiency, unspecified: Secondary | ICD-10-CM | POA: Diagnosis not present

## 2023-01-16 DIAGNOSIS — I10 Essential (primary) hypertension: Secondary | ICD-10-CM | POA: Insufficient documentation

## 2023-01-16 DIAGNOSIS — E785 Hyperlipidemia, unspecified: Secondary | ICD-10-CM | POA: Diagnosis not present

## 2023-01-16 DIAGNOSIS — Z8711 Personal history of peptic ulcer disease: Secondary | ICD-10-CM | POA: Insufficient documentation

## 2023-01-16 DIAGNOSIS — R748 Abnormal levels of other serum enzymes: Secondary | ICD-10-CM

## 2023-01-16 DIAGNOSIS — Z130 Encounter for screening for diseases of the blood and blood-forming organs and certain disorders involving the immune mechanism: Secondary | ICD-10-CM

## 2023-01-16 NOTE — Assessment & Plan Note (Signed)
BP mildly elevated here today.  Home readings have been much better controlled.  Labs today.  Continue valsartan/HCTZ.  Advised to keep a log of her home blood pressure readings and bring it back up on follow-up.

## 2023-01-16 NOTE — Patient Instructions (Signed)
Labs today.   Keep a log of your home BP readings.  Follow up in 3 months.

## 2023-01-16 NOTE — Assessment & Plan Note (Signed)
Unsure of control.  Lipid panel today. ?

## 2023-01-16 NOTE — Progress Notes (Signed)
Subjective:  Patient ID: Kristi Boyd, female    DOB: 1956/02/04  Age: 67 y.o. MRN: [id number]  CC: Establish care   HPI:  67 year old female with hypertension, anxiety, hyperlipidemia, history of peptic ulcer disease presents to establish care.  Patient's blood pressure is elevated here today.  She reports that her blood pressures have been up-and-down at home but she has had some low readings as well in the 80s systolic.  She was recently started on valsartan/HCTZ.  Has not had labs that are visible to me since 2018.  Needs labs today.  Patient states that overall she is feeling well.  No reports of chest pain or shortness of breath.  She is overdue for multiple health maintenance items.  She declines colonoscopy.  She is in need of mammogram.  Mammogram last done in 2022.  Patient Active Problem List   Diagnosis Date Noted   Hyperlipidemia 01/16/2023   GERD (gastroesophageal reflux disease) 01/16/2023   History of peptic ulcer disease 01/16/2023   Essential hypertension 01/16/2023   Vitamin D deficiency 01/16/2023   Hormone replacement therapy, postmenopausal 04/13/2016   Anxiety state 12/13/2006    Social Hx   Social History   Socioeconomic History   Marital status: Divorced    Spouse name: Not on file   Number of children: Not on file   Years of education: Not on file   Highest education level: Not on file  Occupational History   Not on file  Tobacco Use   Smoking status: Every Day    Current packs/day: 0.25    Average packs/day: 0.3 packs/day for 32.0 years (8.0 ttl pk-yrs)    Types: Cigarettes   Smokeless tobacco: Never  Vaping Use   Vaping status: Never Used  Substance and Sexual Activity   Alcohol use: No   Drug use: No   Sexual activity: Not Currently    Birth control/protection: Post-menopausal, Surgical    Comment: tubal  Other Topics Concern   Not on file  Social History Narrative   Not on file   Social Determinants of Health   Financial  Resource Strain: Low Risk  (12/06/2021)   Overall Financial Resource Strain (CARDIA)    Difficulty of Paying Living Expenses: Not hard at all  Food Insecurity: No Food Insecurity (12/06/2021)   Hunger Vital Sign    Worried About Running Out of Food in the Last Year: Never true    Ran Out of Food in the Last Year: Never true  Transportation Needs: No Transportation Needs (12/06/2021)   PRAPARE - Administrator, Civil Service (Medical): No    Lack of Transportation (Non-Medical): No  Physical Activity: Sufficiently Active (12/06/2021)   Exercise Vital Sign    Days of Exercise per Week: 7 days    Minutes of Exercise per Session: 60 min  Stress: Stress Concern Present (12/06/2021)   [employer] of Occupational Health - Occupational Stress Questionnaire    Feeling of Stress : Rather much  Social Connections: Socially Isolated (12/06/2021)   Social Connection and Isolation Panel [NHANES]    Frequency of Communication with Friends and Family: Once a week    Frequency of Social Gatherings with Friends and Family: Once a week    Attends Religious Services: More than 4 times per year    Active Member of [employer] or Organizations: No    Attends Banker Meetings: Never    Marital Status: Divorced    Review of Systems Per HPI  Objective:  BP (!) 155/81   Pulse 73   Temp 98.1 F (36.7 C)   Ht 5\' 3"  (1.6 m)   Wt 155 lb 3.2 oz (70.4 kg)   SpO2 97%   BMI 27.49 kg/m      01/16/2023   11:22 AM 01/16/2023   10:52 AM 12/22/2022   10:24 AM  BP/Weight  Systolic BP 155 171 210  Diastolic BP 81 89 92  Wt. (Lbs)  155.2   BMI  27.49 kg/m2     Physical Exam Vitals and nursing note reviewed.  Constitutional:      General: She is not in acute distress.    Appearance: Normal appearance.  HENT:     Head: Normocephalic and atraumatic.  Eyes:     General:        Right eye: No discharge.        Left eye: No discharge.     Conjunctiva/sclera: Conjunctivae normal.   Cardiovascular:     Rate and Rhythm: Normal rate and regular rhythm.  Pulmonary:     Effort: Pulmonary effort is normal.     Breath sounds: Normal breath sounds. No wheezing, rhonchi or rales.  Neurological:     Mental Status: She is alert.  Psychiatric:        Mood and Affect: Mood normal.        Behavior: Behavior normal.     Lab Results  Component Value Date   WBC 10.3 08/29/2016   HGB 15.9 08/29/2016   HCT 48.0 (H) 08/29/2016   PLT 307 08/29/2016   GLUCOSE 114 (H) 08/29/2016   CHOL 236 (H) 08/29/2016   TRIG 199 (H) 08/29/2016   HDL 42 08/29/2016   LDLCALC 154 (H) 08/29/2016   ALT 12 08/29/2016   AST 13 08/29/2016   NA 140 08/29/2016   K 4.3 08/29/2016   CL 100 08/29/2016   CREATININE 1.07 (H) 08/29/2016   BUN 11 08/29/2016   CO2 23 08/29/2016   TSH 2.270 08/29/2016   HGBA1C 5.4 08/29/2016     Assessment & Plan:   Problem List Items Addressed This Visit       Cardiovascular and Mediastinum   Essential hypertension - Primary    BP mildly elevated here today.  Home readings have been much better controlled.  Labs today.  Continue valsartan/HCTZ.  Advised to keep a log of her home blood pressure readings and bring it back up on follow-up.      Relevant Orders   CMP14+EGFR     Digestive   GERD (gastroesophageal reflux disease)     Other   Vitamin D deficiency    Patient endorses a history of vitamin D deficiency.  Labs today to evaluate.      Relevant Orders   Vitamin D, 25-hydroxy   Hyperlipidemia    Unsure of control.  Lipid panel today.      Relevant Orders   Lipid panel   Other Visit Diagnoses     Screening for deficiency anemia       Relevant Orders   CBC      Follow-up:  3 months  Rosebud Koenen Adriana Simas DO [facility]

## 2023-01-16 NOTE — Assessment & Plan Note (Signed)
Patient endorses a history of vitamin D deficiency.  Labs today to evaluate.

## 2023-01-20 NOTE — Addendum Note (Signed)
Addended by: Margaretha Sheffield on: 01/20/2023 10:52 AM   Modules accepted: Orders

## 2023-01-31 LAB — GAMMA GT: GGT: 21 IU/L (ref 0–60)

## 2023-03-03 ENCOUNTER — Ambulatory Visit (INDEPENDENT_AMBULATORY_CARE_PROVIDER_SITE_OTHER): Payer: PPO

## 2023-03-03 VITALS — Ht 63.0 in | Wt 155.0 lb

## 2023-03-03 DIAGNOSIS — Z0000 Encounter for general adult medical examination without abnormal findings: Secondary | ICD-10-CM

## 2023-03-03 NOTE — Patient Instructions (Signed)
Kristi Boyd , Thank you for taking time to come for your Medicare Wellness Visit. I appreciate your ongoing commitment to your health goals. Please review the following plan we discussed and let me know if I can assist you in the future.   Referrals/Orders/Follow-Ups/Clinician Recommendations:  Next Medicare Annual Wellness Visit: March 08, 2024 at 10:00 am virtual visit  Vaccinations: declines all Influenza vaccine: recommend every Fall Pneumococcal vaccine: recommend once per lifetime Prevnar-20 Tdap vaccine: recommend every 10 years Shingles vaccine: recommend Shingrix which is 2 doses 2-6 months apart and over 90% effective     Covid-19: recommend 2 doses one month apart with a booster 6 months later    This is a list of the screening recommended for you and due dates:  Health Maintenance  Topic Date Due   Pneumonia Vaccine (1 of 2 - PCV) Never done   DTaP/Tdap/Td vaccine (1 - Tdap) Never done   Colon Cancer Screening  Never done   Zoster (Shingles) Vaccine (1 of 2) Never done   DEXA scan (bone density measurement)  Never done   Mammogram  12/19/2022   COVID-19 Vaccine (1 - 2023-24 season) Never done   Flu Shot  09/04/2023*   Medicare Annual Wellness Visit  03/02/2024   Hepatitis C Screening  Completed   HPV Vaccine  Aged Out  *Topic was postponed. The date shown is not the original due date.    Advanced directives: (ACP Link)Information on Advanced Care Planning can be found at [facility] Advance Health Care Directives Advance Health Care Directives ([email or web address]   Next Medicare Annual Wellness Visit scheduled for next year: Yes  Preventive Care 65 Years and Older, Female Preventive care refers to lifestyle choices and visits with your health care provider that can promote health and wellness. Preventive care visits are also called wellness exams. What can I expect for my preventive care visit? Counseling Your health care provider may ask you  questions about your: Medical history, including: Past medical problems. Family medical history. Pregnancy and menstrual history. History of falls. Current health, including: Memory and ability to understand (cognition). Emotional well-being. Home life and relationship well-being. Sexual activity and sexual health. Lifestyle, including: Alcohol, nicotine or tobacco, and drug use. Access to firearms. Diet, exercise, and sleep habits. Work and work Astronomer. Sunscreen use. Safety issues such as seatbelt and bike helmet use. Physical exam Your health care provider will check your: Height and weight. These may be used to calculate your BMI (body mass index). BMI is a measurement that tells if you are at a healthy weight. Waist circumference. This measures the distance around your waistline. This measurement also tells if you are at a healthy weight and may help predict your risk of certain diseases, such as type 2 diabetes and high blood pressure. Heart rate and blood pressure. Body temperature. Skin for abnormal spots. What immunizations do I need?  Vaccines are usually given at various ages, according to a schedule. Your health care provider will recommend vaccines for you based on your age, medical history, and lifestyle or other factors, such as travel or where you work. What tests do I need? Screening Your health care provider may recommend screening tests for certain conditions. This may include: Lipid and cholesterol levels. Hepatitis C test. Hepatitis B test. HIV (human immunodeficiency virus) test. STI (sexually transmitted infection) testing, if you are at risk. Lung cancer screening. Colorectal cancer screening. Diabetes screening. This is done by checking your blood sugar (glucose) after  you have not eaten for a while (fasting). Mammogram. Talk with your health care provider about how often you should have regular mammograms. BRCA-related cancer screening. This may  be done if you have a family history of breast, ovarian, tubal, or peritoneal cancers. Bone density scan. This is done to screen for osteoporosis. Talk with your health care provider about your test results, treatment options, and if necessary, the need for more tests. Follow these instructions at home: Eating and drinking  Eat a diet that includes fresh fruits and vegetables, whole grains, lean protein, and low-fat dairy products. Limit your intake of foods with high amounts of sugar, saturated fats, and salt. Take vitamin and mineral supplements as recommended by your health care provider. Do not drink alcohol if your health care provider tells you not to drink. If you drink alcohol: Limit how much you have to 0-1 drink a day. Know how much alcohol is in your drink. In the [place] one drink equals one 12 oz bottle of beer (355 mL), one 5 oz glass of wine (148 mL), or one 1 oz glass of hard liquor (44 mL). Lifestyle Brush your teeth every morning and night with fluoride toothpaste. Floss one time each day. Exercise for at least 30 minutes 5 or more days each week. Do not use any products that contain nicotine or tobacco. These products include cigarettes, chewing tobacco, and vaping devices, such as e-cigarettes. If you need help quitting, ask your health care provider. Do not use drugs. If you are sexually active, practice safe sex. Use a condom or other form of protection in order to prevent STIs. Take aspirin only as told by your health care provider. Make sure that you understand how much to take and what form to take. Work with your health care provider to find out whether it is safe and beneficial for you to take aspirin daily. Ask your health care provider if you need to take a cholesterol-lowering medicine (statin). Find healthy ways to manage stress, such as: Meditation, yoga, or listening to music. Journaling. Talking to a trusted person. Spending time with friends and  family. Minimize exposure to UV radiation to reduce your risk of skin cancer. Safety Always wear your seat belt while driving or riding in a vehicle. Do not drive: If you have been drinking alcohol. Do not ride with someone who has been drinking. When you are tired or distracted. While texting. If you have been using any mind-altering substances or drugs. Wear a helmet and other protective equipment during sports activities. If you have firearms in your house, make sure you follow all gun safety procedures. What's next? Visit your health care provider once a year for an annual wellness visit. Ask your health care provider how often you should have your eyes and teeth checked. Stay up to date on all vaccines. This information is not intended to replace advice given to you by your health care provider. Make sure you discuss any questions you have with your health care provider. Document Revised: 11/18/2020 Document Reviewed: 11/18/2020 Elsevier Patient Education  2024 [employer]. Understanding Your Risk for Falls Millions of people have serious injuries from falls each year. It is important to understand your risk of falling. Talk with your health care provider about your risk and what you can do to lower it. If you do have a serious fall, make sure to tell your provider. Falling once raises your risk of falling again. How can falls affect me? Serious injuries from  falls are common. These include: Broken bones, such as hip fractures. Head injuries, such as traumatic brain injuries (TBI) or concussions. A fear of falling can cause you to avoid activities and stay at home. This can make your muscles weaker and raise your risk for a fall. What can increase my risk? There are a number of risk factors that increase your risk for falling. The more risk factors you have, the higher your risk of falling. Serious injuries from a fall happen most often to people who are older than 69 years old.  Teenagers and young adults ages 35-29 are also at higher risk. Common risk factors include: Weakness in the lower body. Being generally weak or confused due to long-term (chronic) illness. Dizziness or balance problems. Poor vision. Medicines that cause dizziness or drowsiness. These may include: Medicines for your blood pressure, heart, anxiety, insomnia, or swelling (edema). Pain medicines. Muscle relaxants. Other risk factors include: Drinking alcohol. Having had a fall in the past. Having foot pain or wearing improper footwear. Working at a dangerous job. Having any of the following in your home: Tripping hazards, such as floor clutter or loose rugs. Poor lighting. Pets. Having dementia or memory loss. What actions can I take to lower my risk of falling?     Physical activity Stay physically fit. Do strength and balance exercises. Consider taking a regular class to build strength and balance. Yoga and tai chi are good options. Vision Have your eyes checked every year and your prescription for glasses or contacts updated as needed. Shoes and walking aids Wear non-skid shoes. Wear shoes that have rubber soles and low heels. Do not wear high heels. Do not walk around the house in socks or slippers. Use a cane or walker as told by your provider. Home safety Attach secure railings on both sides of your stairs. Install grab bars for your bathtub, shower, and toilet. Use a non-skid mat in your bathtub or shower. Attach bath mats securely with double-sided, non-slip rug tape. Use good lighting in all rooms. Keep a flashlight near your bed. Make sure there is a clear path from your bed to the bathroom. Use night-lights. Do not use throw rugs. Make sure all carpeting is taped or tacked down securely. Remove all clutter from walkways and stairways, including extension cords. Repair uneven or broken steps and floors. Avoid walking on icy or slippery surfaces. Walk on the grass  instead of on icy or slick sidewalks. Use ice melter to get rid of ice on walkways in the winter. Use a cordless phone. Questions to ask your health care provider Can you help me check my risk for a fall? Do any of my medicines make me more likely to fall? Should I take a vitamin D supplement? What exercises can I do to improve my strength and balance? Should I make an appointment to have my vision checked? Do I need a bone density test to check for weak bones (osteoporosis)? Would it help to use a cane or a walker? Where to find more information Centers for Disease Control and Prevention, [employer]AD[employer]: TonerPromos.no Community-Based Fall Prevention Programs: TonerPromos.no [employer] on Aging: BaseRingTones.pl Contact a health care provider if: You fall at home. You are afraid of falling at home. You feel weak, drowsy, or dizzy. This information is not intended to replace advice given to you by your health care provider. Make sure you discuss any questions you have with your health care provider. Document Revised: 01/24/2022 Document Reviewed: 01/24/2022 Elsevier  Patient Education  2024 [employer].

## 2023-03-03 NOTE — Progress Notes (Signed)
 Because this visit was a virtual/telehealth visit,  certain criteria was not obtained, such a blood pressure, CBG if applicable, and timed get up and go. Any medications not marked as "taking" were not mentioned during the medication reconciliation part of the visit. Any vitals not documented were not able to be obtained due to this being a telehealth visit or patient was unable to self-report a recent blood pressure reading due to a lack of equipment at home via telehealth. Vitals that have been documented are verbally provided by the patient.   Subjective:   Kristi Boyd is a 67 y.o. female who presents for an Initial Medicare Annual Wellness Visit.  Visit Complete: Virtual  I connected with  Kristi Boyd on 03/03/23 by a audio enabled telemedicine application and verified that I am speaking with the correct person using two identifiers.  Patient Location: Home  Provider Location: Home Office  I discussed the limitations of evaluation and management by telemedicine. The patient expressed understanding and agreed to proceed.  Patient Medicare AWV questionnaire was completed by the patient on n/a; I have confirmed that all information answered by patient is correct and no changes since this date.  Cardiac Risk Factors include: advanced age (>69men, >44 women);hypertension;sedentary lifestyle;smoking/ tobacco exposure     Objective:    Today's Vitals   03/03/23 1028  Weight: 155 lb (70.3 kg)  Height: 5\' 3"  (1.6 m)  PainSc: 2    Body mass index is 27.46 kg/m.     03/03/2023   10:04 AM 04/03/2017    2:15 PM 08/04/2016    3:29 PM 04/13/2016   12:06 PM 12/31/2015   10:31 AM 12/14/2015    7:12 AM 12/09/2015   11:25 AM  Advanced Directives  Does Patient Have a Medical Advance Directive? No No No No No No No  Would patient like information on creating a medical advance directive? No - Patient declined No - Patient declined No - Patient declined  No - patient declined information No -  patient declined information No - patient declined information    Current Medications (verified) Outpatient Encounter Medications as of 03/03/2023  Medication Sig   Cetirizine HCl (ZYRTEC PO) Take by mouth.   diazepam (VALIUM) 10 MG tablet Take 0.5-1 tablets (5-10 mg total) by mouth at bedtime as needed.   estradiol (ESTRACE) 1 MG tablet Take 1 tablet (1 mg total) by mouth daily.   Famotidine (PEPCID PO) Take by mouth as needed.   ibuprofen (ADVIL) 200 MG tablet Take 200 mg by mouth as needed.   progesterone (PROMETRIUM) 200 MG capsule Take 1 daily at bedtime   valsartan-hydrochlorothiazide (DIOVAN HCT) 80-12.5 MG tablet Take 1 tablet by mouth daily.   No facility-administered encounter medications on file as of 03/03/2023.    Allergies (verified) Escitalopram oxalate, Nsaids, Paroxetine, Prednisone, Sertraline hcl, and Sulfonamide derivatives   History: Past Medical History:  Diagnosis Date   Allergy    Disc degeneration    Dyslipidemia 09/05/2016   Fibroid 04/19/2016   Plantar fasciitis    Ulcer    Vitamin D deficiency    Past Surgical History:  Procedure Laterality Date   BACK SURGERY     CATARACT EXTRACTION W/PHACO Right 12/14/2015   Procedure: CATARACT EXTRACTION PHACO AND INTRAOCULAR LENS PLACEMENT (IOC);  Surgeon: Gemma Payor, MD;  Location: [facility];  Service: Ophthalmology;  Laterality: Right;  CDE: 4.70   CATARACT EXTRACTION W/PHACO Left 12/31/2015   Procedure: CATARACT EXTRACTION PHACO AND INTRAOCULAR LENS PLACEMENT LEFT  EYE; CDE:6.39;  Surgeon: Gemma Payor, MD;  Location: [facility];  Service: Ophthalmology;  Laterality: Left;   DILATION AND CURETTAGE OF UTERUS     FOOT SURGERY     LEEP  2007   NECK SURGERY     SPINE SURGERY     TUBAL LIGATION     Family History  Problem Relation Age of Onset   Thyroid disease Mother    Cancer Father        pancreatic    Heart attack Paternal Grandfather    Diabetes Paternal Grandfather    Social History   Socioeconomic History    Marital status: Divorced    Spouse name: Not on file   Number of children: Not on file   Years of education: Not on file   Highest education level: Not on file  Occupational History   Not on file  Tobacco Use   Smoking status: Every Day    Current packs/day: 0.25    Average packs/day: 0.3 packs/day for 32.0 years (8.0 ttl pk-yrs)    Types: Cigarettes   Smokeless tobacco: Never  Vaping Use   Vaping status: Never Used  Substance and Sexual Activity   Alcohol use: No   Drug use: No   Sexual activity: Not Currently    Birth control/protection: Post-menopausal, Surgical    Comment: tubal  Other Topics Concern   Not on file  Social History Narrative   Not on file   Social Determinants of Health   Financial Resource Strain: Low Risk  (03/03/2023)   Overall Financial Resource Strain (CARDIA)    Difficulty of Paying Living Expenses: Not hard at all  Food Insecurity: No Food Insecurity (03/03/2023)   Hunger Vital Sign    Worried About Running Out of Food in the Last Year: Never true    Ran Out of Food in the Last Year: Never true  Transportation Needs: No Transportation Needs (03/03/2023)   PRAPARE - Administrator, Civil Service (Medical): No    Lack of Transportation (Non-Medical): No  Physical Activity: Sufficiently Active (03/03/2023)   Exercise Vital Sign    Days of Exercise per Week: 7 days    Minutes of Exercise per Session: 30 min  Stress: Stress Concern Present (03/03/2023)   [employer] of Occupational Health - Occupational Stress Questionnaire    Feeling of Stress : Rather much  Social Connections: Socially Isolated (03/03/2023)   Social Connection and Isolation Panel [NHANES]    Frequency of Communication with Friends and Family: Once a week    Frequency of Social Gatherings with Friends and Family: Once a week    Attends Religious Services: More than 4 times per year    Active Member of [employer] or Organizations: No    Attends Hospital doctor: Never    Marital Status: Never married    Tobacco Counseling Ready to quit: Yes Counseling given: Yes   Clinical Intake:  Pre-visit preparation completed: Yes  Pain : 0-10 Pain Score: 2  Pain Type: Chronic pain Pain Location: Back Pain Orientation: Lower Pain Descriptors / Indicators: Aching, Constant Pain Onset: More than a month ago Pain Frequency: Constant     BMI - recorded: 27.46 Nutritional Status: BMI 25 -29 Overweight Nutritional Risks: None Diabetes: No  How often do you need to have someone help you when you read instructions, pamphlets, or other written materials from your doctor or pharmacy?: 1 - Never  Interpreter Needed?: No  Information entered by ::   Kayler Buckholtz, CMA   Activities of Daily Living    03/03/2023   11:13 AM  In your present state of health, do you have any difficulty performing the following activities:  Hearing? 0  Vision? 0  Difficulty concentrating or making decisions? 0  Walking or climbing stairs? 0  Dressing or bathing? 0  Doing errands, shopping? 0  Preparing Food and eating ? N  Using the Toilet? N  In the past six months, have you accidently leaked urine? N  Do you have problems with loss of bowel control? N  Managing your Medications? N  Managing your Finances? N  Housekeeping or managing your Housekeeping? N    Patient Care Team: Tommie Sams, DO as PCP - General (Family Medicine)  Indicate any recent Medical Services you may have received from other than [facility] providers in the past year (date may be approximate).     Assessment:   This is a routine wellness examination for Kristi Boyd.  Hearing/Vision screen Hearing Screening - Comments:: Patient denies any hearing difficulties.   Vision Screening - Comments:: Does not have an eye doctor and declines referral at this moment. Will contact office if she changes her mind    Goals Addressed             This Visit's Progress    Patient Stated       To  remain active       Depression Screen    03/03/2023   10:35 AM 01/16/2023   11:05 AM 12/06/2021   11:51 AM 09/20/2017    3:23 PM 08/29/2016   10:20 AM 04/13/2016   12:15 PM  PHQ 2/9 Scores  PHQ - 2 Score 0 0 0 0 0 0  PHQ- 9 Score 1 1 5        Fall Risk    03/03/2023   11:13 AM 01/16/2023   11:05 AM 12/06/2021   11:43 AM 12/08/2020   11:21 AM 11/29/2018   12:04 PM  Fall Risk   Falls in the past year? 0 Exclusion - non ambulatory 0 0 0  Number falls in past yr: 0 0     Injury with Fall? 0      Risk for fall due to : Impaired balance/gait;Impaired mobility;Orthopedic patient      Follow up Falls prevention discussed;Education provided        MEDICARE RISK AT HOME: Medicare Risk at Home Any stairs in or around the home?: No If so, are there any without handrails?: No Home free of loose throw rugs in walkways, pet beds, electrical cords, etc?: Yes Adequate lighting in your home to reduce risk of falls?: Yes Life alert?: No Use of a cane, walker or w/c?: No Grab bars in the bathroom?: No Shower chair or bench in shower?: No Elevated toilet seat or a handicapped toilet?: No  TIMED UP AND GO:  Was the test performed? No    Cognitive Function:        03/03/2023   10:34 AM  6CIT Screen  What Year? 0 points  What month? 0 points  What time? 0 points  Count back from 20 0 points  Months in reverse 0 points  Repeat phrase 0 points  Total Score 0 points    Immunizations Immunization History  Administered Date(s) Administered   Influenza-Unspecified 02/26/2016, 03/10/2017, 02/13/2020    TDAP status: Due, Education has been provided regarding the importance of this vaccine. Advised may receive this vaccine at local pharmacy or Health Dept.  Aware to provide a copy of the vaccination record if obtained from local pharmacy or Health Dept. Verbalized acceptance and understanding.  Flu Vaccine status: Due, Education has been provided regarding the importance of this vaccine.  Advised may receive this vaccine at local pharmacy or Health Dept. Aware to provide a copy of the vaccination record if obtained from local pharmacy or Health Dept. Verbalized acceptance and understanding.  Pneumococcal vaccine status: Due, Education has been provided regarding the importance of this vaccine. Advised may receive this vaccine at local pharmacy or Health Dept. Aware to provide a copy of the vaccination record if obtained from local pharmacy or Health Dept. Verbalized acceptance and understanding.  Covid-19 vaccine status: Information provided on how to obtain vaccines.   Qualifies for Shingles Vaccine? Yes   Zostavax completed No   Shingrix Completed?: No.    Education has been provided regarding the importance of this vaccine. Patient has been advised to call insurance company to determine out of pocket expense if they have not yet received this vaccine. Advised may also receive vaccine at local pharmacy or Health Dept. Verbalized acceptance and understanding.  Screening Tests Health Maintenance  Topic Date Due   Medicare Annual Wellness (AWV)  Never done   Pneumonia Vaccine 3+ Years old (1 of 2 - PCV) Never done   DTaP/Tdap/Td (1 - Tdap) Never done   Colonoscopy  Never done   Zoster Vaccines- Shingrix (1 of 2) Never done   DEXA SCAN  Never done   MAMMOGRAM  12/19/2022   COVID-19 Vaccine (1 - 2023-24 season) Never done   INFLUENZA VACCINE  09/04/2023 (Originally 01/05/2023)   Hepatitis C Screening  Completed   HPV VACCINES  Aged Out    Health Maintenance  Health Maintenance Due  Topic Date Due   Medicare Annual Wellness (AWV)  Never done   Pneumonia Vaccine 31+ Years old (1 of 2 - PCV) Never done   DTaP/Tdap/Td (1 - Tdap) Never done   Colonoscopy  Never done   Zoster Vaccines- Shingrix (1 of 2) Never done   DEXA SCAN  Never done   MAMMOGRAM  12/19/2022   COVID-19 Vaccine (1 - 2023-24 season) Never done    Colorectal Cancer Screening: Patient declined colorectal  cancer screening   Mammogram status: Patient declined mammogram   Patient declined bone density screening   Lung Cancer Screening: (Low Dose CT Chest recommended if Age 110-80 years, 20 pack-year currently smoking OR have quit w/in 15years.) does not qualify.   Lung Cancer Screening Referral: na  Additional Screening:  Hepatitis C Screening: does not qualify; Completed 08/29/2016  Vision Screening: Recommended annual ophthalmology exams for early detection of glaucoma and other disorders of the eye. Is the patient up to date with their annual eye exam?  No  Who is the provider or what is the name of the office in which the patient attends annual eye exams? N/a If pt is not established with a provider, would they like to be referred to a provider to establish care? No .   Dental Screening: Recommended annual dental exams for proper oral hygiene  Diabetic Foot Exam: na  Community Resource Referral / Chronic Care Management: CRR required this visit?  No   CCM required this visit?  No     Plan:     I have personally reviewed and noted the following in the patient's chart:   Medical and social history Use of alcohol, tobacco or illicit drugs  Current medications and supplements  including opioid prescriptions. Patient is not currently taking opioid prescriptions. Functional ability and status Nutritional status Physical activity Advanced directives List of other physicians Hospitalizations, surgeries, and ER visits in previous 12 months Vitals Screenings to include cognitive, depression, and falls Referrals and appointments  In addition, I have reviewed and discussed with patient certain preventive protocols, quality metrics, and best practice recommendations. A written personalized care plan for preventive services as well as general preventive health recommendations were provided to patient.     Jordan Hawks Anjuli Gemmill, CMA   03/03/2023   After Visit Summary: (Mail) Due to  this being a telephonic visit, the after visit summary with patients personalized plan was offered to patient via mail   Nurse Notes: n/a

## 2023-03-10 ENCOUNTER — Other Ambulatory Visit: Payer: Self-pay | Admitting: Family Medicine

## 2023-03-10 DIAGNOSIS — Z1212 Encounter for screening for malignant neoplasm of rectum: Secondary | ICD-10-CM

## 2023-03-10 DIAGNOSIS — Z1211 Encounter for screening for malignant neoplasm of colon: Secondary | ICD-10-CM

## 2023-04-18 ENCOUNTER — Encounter (INDEPENDENT_AMBULATORY_CARE_PROVIDER_SITE_OTHER): Payer: Self-pay | Admitting: *Deleted

## 2023-04-18 ENCOUNTER — Ambulatory Visit: Payer: PPO | Admitting: Family Medicine

## 2023-04-18 VITALS — BP 167/89 | HR 93 | Temp 98.4°F | Ht 63.0 in | Wt 148.6 lb

## 2023-04-18 DIAGNOSIS — I10 Essential (primary) hypertension: Secondary | ICD-10-CM

## 2023-04-18 DIAGNOSIS — E785 Hyperlipidemia, unspecified: Secondary | ICD-10-CM | POA: Diagnosis not present

## 2023-04-18 DIAGNOSIS — Z1211 Encounter for screening for malignant neoplasm of colon: Secondary | ICD-10-CM | POA: Diagnosis not present

## 2023-04-18 DIAGNOSIS — M79604 Pain in right leg: Secondary | ICD-10-CM | POA: Insufficient documentation

## 2023-04-18 DIAGNOSIS — Z23 Encounter for immunization: Secondary | ICD-10-CM | POA: Diagnosis not present

## 2023-04-18 NOTE — Assessment & Plan Note (Signed)
Stable Home readings.  Continue valsartan/HCTZ.

## 2023-04-18 NOTE — Progress Notes (Signed)
Subjective:  Patient ID: Kristi Boyd, female    DOB: June 24, 1955  Age: 67 y.o. MRN: [id number]  CC:  Follow up   HPI:  67 year old female presents for follow-up.  Patient's blood pressure is elevated here today.  She brings in her home readings.  Her home readings are at goal.  She is compliant with valsartan/HCTZ.  Patient has recently quit smoking.  Congratulated today.  Patient is overdue for mammogram.  Advised her to call and get this scheduled.  Patient in need of colonoscopy.  Will refer to GI.  Patient is amenable to getting her pneumococcal vaccine today.  She states that she is planning on getting her shingles vaccine at the pharmacy.  Patient reports recent right leg pain.  Seems to be improving.  Has been going on for the past 6 weeks.  No fall, trauma, injury.  Patient Active Problem List   Diagnosis Date Noted   Right leg pain 04/18/2023   Hyperlipidemia 01/16/2023   GERD (gastroesophageal reflux disease) 01/16/2023   History of peptic ulcer disease 01/16/2023   Essential hypertension 01/16/2023   Vitamin D deficiency 01/16/2023   Hormone replacement therapy, postmenopausal 04/13/2016   Anxiety state 12/13/2006    Social Hx   Social History   Socioeconomic History   Marital status: Divorced    Spouse name: Not on file   Number of children: Not on file   Years of education: Not on file   Highest education level: Not on file  Occupational History   Not on file  Tobacco Use   Smoking status: Every Day    Current packs/day: 0.25    Average packs/day: 0.3 packs/day for 32.0 years (8.0 ttl pk-yrs)    Types: Cigarettes   Smokeless tobacco: Never  Vaping Use   Vaping status: Never Used  Substance and Sexual Activity   Alcohol use: No   Drug use: No   Sexual activity: Not Currently    Birth control/protection: Post-menopausal, Surgical    Comment: tubal  Other Topics Concern   Not on file  Social History Narrative   Not on file   Social Determinants  of Health   Financial Resource Strain: Low Risk  (03/03/2023)   Overall Financial Resource Strain (CARDIA)    Difficulty of Paying Living Expenses: Not hard at all  Food Insecurity: No Food Insecurity (03/03/2023)   Hunger Vital Sign    Worried About Running Out of Food in the Last Year: Never true    Ran Out of Food in the Last Year: Never true  Transportation Needs: No Transportation Needs (03/03/2023)   PRAPARE - Administrator, Civil Service (Medical): No    Lack of Transportation (Non-Medical): No  Physical Activity: Sufficiently Active (03/03/2023)   Exercise Vital Sign    Days of Exercise per Week: 7 days    Minutes of Exercise per Session: 30 min  Stress: Stress Concern Present (03/03/2023)   [employer] of Occupational Health - Occupational Stress Questionnaire    Feeling of Stress : Rather much  Social Connections: Socially Isolated (03/03/2023)   Social Connection and Isolation Panel [NHANES]    Frequency of Communication with Friends and Family: Once a week    Frequency of Social Gatherings with Friends and Family: Once a week    Attends Religious Services: More than 4 times per year    Active Member of [employer] or Organizations: No    Attends Banker Meetings: Never    Marital Status:  Never married    Review of Systems Per HPI  Objective:  BP (!) 167/89   Pulse 93   Temp 98.4 F (36.9 C)   Ht 5\' 3"  (1.6 m)   Wt 148 lb 9.6 oz (67.4 kg)   SpO2 99%   BMI 26.32 kg/m      04/18/2023   10:57 AM 04/18/2023   10:00 AM 03/03/2023   10:28 AM  BP/Weight  Systolic BP 167 168 --  Diastolic BP 89 94 --  Wt. (Lbs)  148.6 155  BMI  26.32 kg/m2 27.46 kg/m2    Physical Exam Vitals and nursing note reviewed.  Constitutional:      General: She is not in acute distress.    Appearance: Normal appearance.  HENT:     Head: Normocephalic and atraumatic.  Eyes:     General:        Right eye: No discharge.        Left eye: No discharge.      Conjunctiva/sclera: Conjunctivae normal.  Cardiovascular:     Rate and Rhythm: Normal rate and regular rhythm.  Pulmonary:     Effort: Pulmonary effort is normal.     Breath sounds: Normal breath sounds. No wheezing, rhonchi or rales.  Musculoskeletal:     Comments: Right lower extremity with no tenderness to palpation.  No appreciable swelling.  Neurological:     Mental Status: She is alert.  Psychiatric:        Mood and Affect: Mood normal.        Behavior: Behavior normal.     Lab Results  Component Value Date   WBC 9.4 01/16/2023   HGB 15.1 01/16/2023   HCT 45.0 01/16/2023   PLT 272 01/16/2023   GLUCOSE 105 (H) 01/16/2023   CHOL 280 (H) 01/16/2023   TRIG 421 (H) 01/16/2023   HDL 47 01/16/2023   LDLCALC 154 (H) 01/16/2023   ALT 12 01/16/2023   AST 15 01/16/2023   NA 143 01/16/2023   K 5.0 01/16/2023   CL 103 01/16/2023   CREATININE 1.27 (H) 01/16/2023   BUN 16 01/16/2023   CO2 22 01/16/2023   TSH 2.270 08/29/2016   HGBA1C 5.4 08/29/2016     Assessment & Plan:   Problem List Items Addressed This Visit       Cardiovascular and Mediastinum   Essential hypertension - Primary    Stable Home readings.  Continue valsartan/HCTZ.        Other   Right leg pain    Exam unremarkable.  Advised supportive care.      Hyperlipidemia    Uncontrolled.  Patient has declined treatment.      Other Visit Diagnoses     Encounter for screening colonoscopy       Relevant Orders   Ambulatory referral to Gastroenterology   Immunization due       Relevant Orders   Pneumococcal conjugate vaccine 20-valent (Prevnar 20) (Completed)       Follow-up:  Return in about 6 months (around 10/16/2023) for Follow up Chronic medical issues.  Everlene Other DO [facility]

## 2023-04-18 NOTE — Patient Instructions (Signed)
Monitor BP @ home.  Continue your medications.  Tylenol as needed for pain.  Avoid Ibuprofen.  Call [phone] to schedule mammogram.

## 2023-04-18 NOTE — Assessment & Plan Note (Addendum)
Uncontrolled.  Patient has declined treatment.

## 2023-04-18 NOTE — Assessment & Plan Note (Signed)
Exam unremarkable.  Advised supportive care.

## 2023-08-29 DIAGNOSIS — M79672 Pain in left foot: Secondary | ICD-10-CM | POA: Diagnosis not present

## 2023-08-29 DIAGNOSIS — S92355A Nondisplaced fracture of fifth metatarsal bone, left foot, initial encounter for closed fracture: Secondary | ICD-10-CM | POA: Diagnosis not present

## 2023-09-05 ENCOUNTER — Other Ambulatory Visit: Payer: Self-pay | Admitting: Obstetrics & Gynecology

## 2023-09-11 DIAGNOSIS — S92355A Nondisplaced fracture of fifth metatarsal bone, left foot, initial encounter for closed fracture: Secondary | ICD-10-CM | POA: Diagnosis not present

## 2023-10-16 ENCOUNTER — Ambulatory Visit: Payer: PPO | Admitting: Family Medicine

## 2023-10-16 ENCOUNTER — Encounter (INDEPENDENT_AMBULATORY_CARE_PROVIDER_SITE_OTHER): Payer: Self-pay | Admitting: *Deleted

## 2023-10-17 ENCOUNTER — Ambulatory Visit: Payer: PPO | Admitting: Family Medicine

## 2023-10-23 DIAGNOSIS — S92355A Nondisplaced fracture of fifth metatarsal bone, left foot, initial encounter for closed fracture: Secondary | ICD-10-CM | POA: Diagnosis not present

## 2023-12-10 ENCOUNTER — Other Ambulatory Visit: Payer: Self-pay | Admitting: Obstetrics & Gynecology

## 2024-01-22 DIAGNOSIS — S92355D Nondisplaced fracture of fifth metatarsal bone, left foot, subsequent encounter for fracture with routine healing: Secondary | ICD-10-CM | POA: Diagnosis not present

## 2024-02-06 ENCOUNTER — Other Ambulatory Visit (HOSPITAL_COMMUNITY)
Admission: RE | Admit: 2024-02-06 | Discharge: 2024-02-06 | Disposition: A | Discharge status: Home / Self Care | Source: Ambulatory Visit | Attending: Obstetrics & Gynecology | Admitting: Obstetrics & Gynecology

## 2024-02-06 ENCOUNTER — Encounter: Payer: Self-pay | Admitting: Obstetrics & Gynecology

## 2024-02-06 ENCOUNTER — Ambulatory Visit: Admitting: Obstetrics & Gynecology

## 2024-02-06 VITALS — BP 161/93 | HR 88 | Ht 63.0 in | Wt 148.0 lb

## 2024-02-06 DIAGNOSIS — Z1231 Encounter for screening mammogram for malignant neoplasm of breast: Secondary | ICD-10-CM

## 2024-02-06 DIAGNOSIS — Z01419 Encounter for gynecological examination (general) (routine) without abnormal findings: Secondary | ICD-10-CM | POA: Insufficient documentation

## 2024-02-06 DIAGNOSIS — Z79890 Hormone replacement therapy (postmenopausal): Secondary | ICD-10-CM

## 2024-02-06 MED ORDER — ESTRADIOL 1 MG PO TABS: 1.0000 mg | ORAL_TABLET | Freq: Every day | ORAL | 3 refills | Status: AC

## 2024-02-06 MED ORDER — PROGESTERONE 200 MG PO CAPS: ORAL_CAPSULE | ORAL | 3 refills | Status: AC

## 2024-02-06 NOTE — Progress Notes (Addendum)
 Subjective:     Kristi Boyd is a 68 y.o. female here for a routine exam.  No LMP recorded. Patient is postmenopausal. [id number] Birth Control Method:  na Menstrual Calendar(currently): occasional brown spotting  Current complaints: .   Current acute medical issues:     Recent Gynecologic History No LMP recorded. Patient is postmenopausal. Last Pap: 2023,  ASCUS Last mammogram: 2022,  normal  Past Medical History:  Diagnosis Date   Allergy    Disc degeneration    Dyslipidemia 09/05/2016   Fibroid 04/19/2016   Plantar fasciitis    Ulcer    Vitamin D  deficiency     Past Surgical History:  Procedure Laterality Date   BACK SURGERY     CATARACT EXTRACTION W/PHACO Right 12/14/2015   Procedure: CATARACT EXTRACTION PHACO AND INTRAOCULAR LENS PLACEMENT (IOC);  Surgeon: Cherene Mania, MD;  Location: [facility];  Service: Ophthalmology;  Laterality: Right;  CDE: 4.70   CATARACT EXTRACTION W/PHACO Left 12/31/2015   Procedure: CATARACT EXTRACTION PHACO AND INTRAOCULAR LENS PLACEMENT LEFT EYE; CDE:6.39;  Surgeon: Cherene Mania, MD;  Location: [facility];  Service: Ophthalmology;  Laterality: Left;   DILATION AND CURETTAGE OF UTERUS     FOOT SURGERY     LEEP  2007   NECK SURGERY     SPINE SURGERY     TUBAL LIGATION      OB History     Gravida  2   Para  1   Term  1   Preterm      AB  1   Living  1      SAB  1   IAB      Ectopic      Multiple      Live Births  1           Social History   Socioeconomic History   Marital status: Divorced    Spouse name: Not on file   Number of children: Not on file   Years of education: Not on file   Highest education level: Not on file  Occupational History   Not on file  Tobacco Use   Smoking status: Every Day    Current packs/day: 0.25    Average packs/day: 0.3 packs/day for 32.0 years (8.0 ttl pk-yrs)    Types: Cigarettes   Smokeless tobacco: Never  Vaping Use   Vaping status: Never Used  Substance and Sexual Activity    Alcohol use: No   Drug use: No   Sexual activity: Not Currently    Birth control/protection: Post-menopausal, Surgical    Comment: tubal  Other Topics Concern   Not on file  Social History Narrative   Not on file   Social Drivers of Health   Financial Resource Strain: Low Risk  (03/03/2023)   Overall Financial Resource Strain (CARDIA)    Difficulty of Paying Living Expenses: Not hard at all  Food Insecurity: No Food Insecurity (03/03/2023)   Hunger Vital Sign    Worried About Running Out of Food in the Last Year: Never true    Ran Out of Food in the Last Year: Never true  Transportation Needs: No Transportation Needs (03/03/2023)   PRAPARE - Administrator, Civil Service (Medical): No    Lack of Transportation (Non-Medical): No  Physical Activity: Sufficiently Active (03/03/2023)   Exercise Vital Sign    Days of Exercise per Week: 7 days    Minutes of Exercise per Session: 30 min  Stress: Stress Concern  Present (03/03/2023)   [employer] of Occupational Health - Occupational Stress Questionnaire    Feeling of Stress : Rather much  Social Connections: Socially Isolated (03/03/2023)   Social Connection and Isolation Panel    Frequency of Communication with Friends and Family: Once a week    Frequency of Social Gatherings with Friends and Family: Once a week    Attends Religious Services: More than 4 times per year    Active Member of [employer] or Organizations: No    Attends Engineer, structural: Never    Marital Status: Never married    Family History  Problem Relation Age of Onset   Thyroid disease Mother    Cancer Father        pancreatic    Heart attack Paternal Grandfather    Diabetes Paternal Grandfather      Current Outpatient Medications:    Cetirizine HCl (ZYRTEC PO), Take by mouth., Disp: , Rfl:    diazepam  (VALIUM ) 10 MG tablet, Take 0.5-1 tablets (5-10 mg total) by mouth at bedtime as needed., Disp: 90 tablet, Rfl: 1   Famotidine (PEPCID  PO), Take by mouth as needed., Disp: , Rfl:    ibuprofen (ADVIL) 200 MG tablet, Take 200 mg by mouth as needed., Disp: , Rfl:    valsartan -hydrochlorothiazide  (DIOVAN -HCT) 80-12.5 MG tablet, Take 1 tablet by mouth daily., Disp: 90 tablet, Rfl: 3   estradiol  (ESTRACE ) 1 MG tablet, Take 1 tablet (1 mg total) by mouth daily., Disp: 90 tablet, Rfl: 3   progesterone  (PROMETRIUM ) 200 MG capsule, TAKE (1) CAPSULE BY MOUTH AT BEDTIME., Disp: 90 capsule, Rfl: 3  Review of Systems  Review of Systems  Constitutional: Negative for fever, chills, weight loss, malaise/fatigue and diaphoresis.  HENT: Negative for hearing loss, ear pain, nosebleeds, congestion, sore throat, neck pain, tinnitus and ear discharge.   Eyes: Negative for blurred vision, double vision, photophobia, pain, discharge and redness.  Respiratory: Negative for cough, hemoptysis, sputum production, shortness of breath, wheezing and stridor.   Cardiovascular: Negative for chest pain, palpitations, orthopnea, claudication, leg swelling and PND.  Gastrointestinal: negative for abdominal pain. Negative for heartburn, nausea, vomiting, diarrhea, constipation, blood in stool and melena.  Genitourinary: Negative for dysuria, urgency, frequency, hematuria and flank pain.  Musculoskeletal: Negative for myalgias, back pain, joint pain and falls.  Skin: Negative for itching and rash.  Neurological: Negative for dizziness, tingling, tremors, sensory change, speech change, focal weakness, seizures, loss of consciousness, weakness and headaches.  Endo/Heme/Allergies: Negative for environmental allergies and polydipsia. Does not bruise/bleed easily.  Psychiatric/Behavioral: Negative for depression, suicidal ideas, hallucinations, memory loss and substance abuse. The patient is not nervous/anxious and does not have insomnia.        Objective:  Blood pressure (!) 161/93, pulse 88, height 5' 3 (1.6 m), weight 148 lb (67.1 kg).   Physical Exam  Vitals  reviewed. Constitutional: She is oriented to person, place, and time. She appears well-developed and well-nourished.  HENT:  Head: Normocephalic and atraumatic.        Right Ear: External ear normal.  Left Ear: External ear normal.  Nose: Nose normal.  Mouth/Throat: Oropharynx is clear and moist.  Eyes: Conjunctivae and EOM are normal. Pupils are equal, round, and reactive to light. Right eye exhibits no discharge. Left eye exhibits no discharge. No scleral icterus.  Neck: Normal range of motion. Neck supple. No tracheal deviation present. No thyromegaly present.  Cardiovascular: Normal rate, regular rhythm, normal heart sounds and intact distal pulses.  Exam reveals no gallop and no friction rub.   No murmur heard. Respiratory: Effort normal and breath sounds normal. No respiratory distress. She has no wheezes. She has no rales. She exhibits no tenderness.  GI: Soft. Bowel sounds are normal. She exhibits no distension and no mass. There is no tenderness. There is no rebound and no guarding.  Genitourinary:  Breasts no masses skin changes or nipple changes bilaterally      Vulva is normal without lesions Vagina is pink moist without discharge Cervix normal in appearance and pap is done Uterus is normal size shape and contour Adnexa is negative with normal sized ovaries   Musculoskeletal: Normal range of motion. She exhibits no edema and no tenderness.  Neurological: She is alert and oriented to person, place, and time. She has normal reflexes. She displays normal reflexes. No cranial nerve deficit. She exhibits normal muscle tone. Coordination normal.  Skin: Skin is warm and dry. No rash noted. No erythema. No pallor.  Psychiatric: She has a normal mood and affect. Her behavior is normal. Judgment and thought content normal.       Medications Ordered at today's visit: Meds ordered this encounter  Medications   estradiol  (ESTRACE ) 1 MG tablet    Sig: Take 1 tablet (1 mg total) by  mouth daily.    Dispense:  90 tablet    Refill:  3    This prescription was filled on 09/21/2023. Any refills authorized will be placed on file.   progesterone  (PROMETRIUM ) 200 MG capsule    Sig: TAKE (1) CAPSULE BY MOUTH AT BEDTIME.    Dispense:  90 capsule    Refill:  3    This prescription was filled on 09/21/2023. Any refills authorized will be placed on file.    Other orders placed at today's visit: Orders Placed This Encounter  Procedures   MM 3D SCREENING MAMMOGRAM BILATERAL BREAST     ASSESSMENT + PLAN:    ICD-10-CM   1. Well woman exam with routine gynecological exam  Z01.419     2. Encounter for gynecological examination with Papanicolaou smear of cervix  Z01.419 Cytology - PAP( )    3. Hormone replacement therapy, postmenopausal: estrace  1 mg + prometrium  200 mg qhs  Z79.890     4. Breast cancer screening by mammogram  Z12.31 MM 3D SCREENING MAMMOGRAM BILATERAL BREAST          No follow-ups on file.

## 2024-02-06 NOTE — Addendum Note (Signed)
 Addended by: Jashaun Penrose H on: 02/06/2024 11:48 AM   Modules accepted: Orders

## 2024-02-12 LAB — CYTOLOGY - PAP
Adequacy: ABSENT
Comment: NEGATIVE
Diagnosis: NEGATIVE
High risk HPV: NEGATIVE

## 2024-02-22 ENCOUNTER — Ambulatory Visit (HOSPITAL_COMMUNITY)
Admission: RE | Admit: 2024-02-22 | Discharge: 2024-02-22 | Disposition: A | Discharge status: Home / Self Care | Source: Ambulatory Visit | Attending: Obstetrics & Gynecology | Admitting: Obstetrics & Gynecology

## 2024-02-22 ENCOUNTER — Encounter (HOSPITAL_COMMUNITY): Payer: Self-pay

## 2024-02-22 DIAGNOSIS — Z1231 Encounter for screening mammogram for malignant neoplasm of breast: Secondary | ICD-10-CM | POA: Diagnosis not present

## 2024-02-29 DIAGNOSIS — Z85828 Personal history of other malignant neoplasm of skin: Secondary | ICD-10-CM | POA: Diagnosis not present

## 2024-02-29 DIAGNOSIS — Z08 Encounter for follow-up examination after completed treatment for malignant neoplasm: Secondary | ICD-10-CM | POA: Diagnosis not present

## 2024-02-29 DIAGNOSIS — B078 Other viral warts: Secondary | ICD-10-CM | POA: Diagnosis not present

## 2024-02-29 DIAGNOSIS — C44319 Basal cell carcinoma of skin of other parts of face: Secondary | ICD-10-CM | POA: Diagnosis not present

## 2024-03-08 ENCOUNTER — Ambulatory Visit: Payer: PPO

## 2024-03-08 VITALS — Ht 63.0 in | Wt 148.0 lb

## 2024-03-08 DIAGNOSIS — Z0000 Encounter for general adult medical examination without abnormal findings: Secondary | ICD-10-CM

## 2024-03-08 NOTE — Patient Instructions (Signed)
 Kristi Boyd,  Thank you for taking the time for your Medicare Wellness Visit. I appreciate your continued commitment to your health goals. Please review the care plan we discussed, and feel free to reach out if I can assist you further.  Medicare recommends these wellness visits once per year to help you and your care team stay ahead of potential health issues. These visits are designed to focus on prevention, allowing your provider to concentrate on managing your acute and chronic conditions during your regular appointments.  Please note that Annual Wellness Visits do not include a physical exam. Some assessments may be limited, especially if the visit was conducted virtually. If needed, we may recommend a separate in-person follow-up with your provider.  Ongoing Care Seeing your primary care provider every 3 to 6 months helps us  monitor your health and provide consistent, personalized care.   Referrals If a referral was made during today's visit and you haven't received any updates within two weeks, please contact the referred provider directly to check on the status.  Recommended Screenings:  Health Maintenance  Topic Date Due   DTaP/Tdap/Td vaccine (1 - Tdap) Never done   Cologuard (Stool DNA test)  Never done   Zoster (Shingles) Vaccine (1 of 2) Never done   DEXA scan (bone density measurement)  Never done   Flu Shot  01/05/2024   COVID-19 Vaccine (7 - 2024-25 season) 02/05/2024   Medicare Annual Wellness Visit  03/08/2025   Breast Cancer Screening  02/21/2026   Pneumococcal Vaccine for age over 27  Completed   Hepatitis C Screening  Completed   HPV Vaccine  Aged Out   Meningitis B Vaccine  Aged Out       03/08/2024    9:40 AM  Advanced Directives  Does Patient Have a Medical Advance Directive? No  Would patient like information on creating a medical advance directive? Yes (MAU/Ambulatory/Procedural Areas - Information given)   Advance Care Planning is important because  it: Ensures you receive medical care that aligns with your values, goals, and preferences. Provides guidance to your family and loved ones, reducing the emotional burden of decision-making during critical moments.  Information on Advanced Care Planning can be found at Natalia  Secretary of [facility] Advance Health Care Directives Advance Health Care Directives ([email or web address]   Vision: Annual vision screenings are recommended for early detection of glaucoma, cataracts, and diabetic retinopathy. These exams can also reveal signs of chronic conditions such as diabetes and high blood pressure.  Dental: Annual dental screenings help detect early signs of oral cancer, gum disease, and other conditions linked to overall health, including heart disease and diabetes.  Please see the attached documents for additional preventive care recommendations.

## 2024-03-08 NOTE — Progress Notes (Signed)
 Subjective:   Kristi Boyd is a 68 y.o. who presents for a Medicare Wellness preventive visit.  As a reminder, Annual Wellness Visits don't include a physical exam, and some assessments may be limited, especially if this visit is performed virtually. We may recommend an in-person follow-up visit with your provider if needed.  Visit Complete: Virtual I connected with  Kristi Boyd on 03/08/24 by a audio enabled telemedicine application and verified that I am speaking with the correct person using two identifiers.  Patient Location: Home  Provider Location: Home Office  I discussed the limitations of evaluation and management by telemedicine. The patient expressed understanding and agreed to proceed.  Vital Signs: Because this visit was a virtual/telehealth visit, some criteria may be missing or patient reported. Any vitals not documented were not able to be obtained and vitals that have been documented are patient reported.  VideoDeclined- This patient declined Librarian, academic. Therefore the visit was completed with audio only.  Persons Participating in Visit: Patient.  AWV Questionnaire: No: Patient Medicare AWV questionnaire was not completed prior to this visit.  Cardiac Risk Factors include: advanced age (>6men, >29 women);hypertension;smoking/ tobacco exposure     Objective:    Today's Vitals   03/08/24 0930  Weight: 148 lb (67.1 kg)  Height: 5' 3 (1.6 m)   Body mass index is 26.22 kg/m.     03/08/2024    9:40 AM 03/03/2023   10:04 AM 04/03/2017    2:15 PM 08/04/2016    3:29 PM 04/13/2016   12:06 PM 12/31/2015   10:31 AM 12/14/2015    7:12 AM  Advanced Directives  Does Patient Have a Medical Advance Directive? No No No  No  No  No  No   Would patient like information on creating a medical advance directive? Yes (MAU/Ambulatory/Procedural Areas - Information given) No - Patient declined No - Patient declined  No - Patient declined   No -  patient declined information  No - patient declined information      Data saved with a previous flowsheet row definition    Current Medications (verified) Outpatient Encounter Medications as of 03/08/2024  Medication Sig   Cetirizine HCl (ZYRTEC PO) Take by mouth.   diazepam  (VALIUM ) 10 MG tablet Take 0.5-1 tablets (5-10 mg total) by mouth at bedtime as needed.   estradiol  (ESTRACE ) 1 MG tablet Take 1 tablet (1 mg total) by mouth daily.   Famotidine (PEPCID PO) Take by mouth as needed.   ibuprofen (ADVIL) 200 MG tablet Take 200 mg by mouth as needed.   progesterone  (PROMETRIUM ) 200 MG capsule TAKE (1) CAPSULE BY MOUTH AT BEDTIME.   valsartan -hydrochlorothiazide  (DIOVAN -HCT) 80-12.5 MG tablet Take 1 tablet by mouth daily.   No facility-administered encounter medications on file as of 03/08/2024.    Allergies (verified) Escitalopram oxalate, Nsaids, Paroxetine, Prednisone, Sertraline hcl, and Sulfonamide derivatives   History: Past Medical History:  Diagnosis Date   Allergy    Disc degeneration    Dyslipidemia 09/05/2016   Fibroid 04/19/2016   Plantar fasciitis    Ulcer    Vitamin D  deficiency    Past Surgical History:  Procedure Laterality Date   BACK SURGERY     CATARACT EXTRACTION W/PHACO Right 12/14/2015   Procedure: CATARACT EXTRACTION PHACO AND INTRAOCULAR LENS PLACEMENT (IOC);  Surgeon: Cherene Mania, MD;  Location: [facility];  Service: Ophthalmology;  Laterality: Right;  CDE: 4.70   CATARACT EXTRACTION W/PHACO Left 12/31/2015   Procedure: CATARACT  EXTRACTION PHACO AND INTRAOCULAR LENS PLACEMENT LEFT EYE; CDE:6.39;  Surgeon: Cherene Mania, MD;  Location: [facility];  Service: Ophthalmology;  Laterality: Left;   DILATION AND CURETTAGE OF UTERUS     FOOT SURGERY     LEEP  2007   NECK SURGERY     SPINE SURGERY     TUBAL LIGATION     Family History  Problem Relation Age of Onset   Thyroid disease Mother    Cancer Father        pancreatic    Heart attack Paternal Grandfather     Diabetes Paternal Grandfather    Social History   Socioeconomic History   Marital status: Divorced    Spouse name: Not on file   Number of children: Not on file   Years of education: Not on file   Highest education level: Not on file  Occupational History   Not on file  Tobacco Use   Smoking status: Every Day    Current packs/day: 0.25    Average packs/day: 0.3 packs/day for 32.0 years (8.0 ttl pk-yrs)    Types: Cigarettes   Smokeless tobacco: Never  Vaping Use   Vaping status: Never Used  Substance and Sexual Activity   Alcohol use: No   Drug use: No   Sexual activity: Not Currently    Birth control/protection: Post-menopausal, Surgical    Comment: tubal  Other Topics Concern   Not on file  Social History Narrative   Not on file   Social Drivers of Health   Financial Resource Strain: Low Risk  (03/08/2024)   Overall Financial Resource Strain (CARDIA)    Difficulty of Paying Living Expenses: Not hard at all  Food Insecurity: No Food Insecurity (03/08/2024)   Hunger Vital Sign    Worried About Running Out of Food in the Last Year: Never true    Ran Out of Food in the Last Year: Never true  Transportation Needs: No Transportation Needs (03/08/2024)   PRAPARE - Administrator, Civil Service (Medical): No    Lack of Transportation (Non-Medical): No  Physical Activity: Sufficiently Active (03/08/2024)   Exercise Vital Sign    Days of Exercise per Week: 5 days    Minutes of Exercise per Session: 30 min  Stress: Stress Concern Present (03/08/2024)   [employer] of Occupational Health - Occupational Stress Questionnaire    Feeling of Stress: To some extent  Social Connections: Socially Isolated (03/08/2024)   Social Connection and Isolation Panel    Frequency of Communication with Friends and Family: Once a week    Frequency of Social Gatherings with Friends and Family: Once a week    Attends Religious Services: More than 4 times per year    Active Member of  [employer] or Organizations: No    Attends Banker Meetings: Never    Marital Status: Never married    Tobacco Counseling Ready to quit: Not Answered Counseling given: Not Answered    Clinical Intake:  Pre-visit preparation completed: Yes  Pain : No/denies pain  Diabetes: No  Lab Results  Component Value Date   HGBA1C 5.4 08/29/2016     How often do you need to have someone help you when you read instructions, pamphlets, or other written materials from your doctor or pharmacy?: 1 - Never  Interpreter Needed?: No  Information entered by :: Charmaine Bloodgood LPN   Activities of Daily Living     03/08/2024    9:39 AM  In your  present state of health, do you have any difficulty performing the following activities:  Hearing? 0  Vision? 0  Difficulty concentrating or making decisions? 0  Walking or climbing stairs? 0  Dressing or bathing? 0  Doing errands, shopping? 0  Preparing Food and eating ? N  Using the Toilet? N  In the past six months, have you accidently leaked urine? N  Do you have problems with loss of bowel control? N  Managing your Medications? N  Managing your Finances? N  Housekeeping or managing your Housekeeping? N    Patient Care Team: Cook, Jayce G, DO as PCP - General (Family Medicine) Jayne Vonn DEL, MD as Consulting Physician (Obstetrics and Gynecology)  I have updated your Care Teams any recent Medical Services you may have received from other providers in the past year.     Assessment:   This is a routine wellness examination for Kristi Boyd.  Hearing/Vision screen Hearing Screening - Comments:: Patient is able to hear conversational tones without difficulty. No issues reported.   Vision Screening - Comments:: No vision problems; will schedule routine eye exam    Goals Addressed             This Visit's Progress    Patient Stated   On track    To remain active       Depression Screen     03/08/2024    9:38 AM 03/03/2023    10:35 AM 01/16/2023   11:05 AM 12/06/2021   11:51 AM 09/20/2017    3:23 PM 08/29/2016   10:20 AM 04/13/2016   12:15 PM  PHQ 2/9 Scores  PHQ - 2 Score 0 0 0 0 0 0 0  PHQ- 9 Score  1 1 5        Fall Risk     03/08/2024    9:39 AM 03/03/2023   11:13 AM 01/16/2023   11:05 AM 12/06/2021   11:43 AM 12/08/2020   11:21 AM  Fall Risk   Falls in the past year? 1 0 Exclusion - non ambulatory 0 0  Number falls in past yr: 0 0 0    Injury with Fall? 1 0     Risk for fall due to : History of fall(s) Impaired balance/gait;Impaired mobility;Orthopedic patient     Follow up Education provided;Falls prevention discussed;Falls evaluation completed Falls prevention discussed;Education provided       MEDICARE RISK AT HOME:  Medicare Risk at Home Any stairs in or around the home?: Yes If so, are there any without handrails?: No Home free of loose throw rugs in walkways, pet beds, electrical cords, etc?: Yes Adequate lighting in your home to reduce risk of falls?: Yes Life alert?: No Use of a cane, walker or w/c?: No Grab bars in the bathroom?: Yes Shower chair or bench in shower?: No Elevated toilet seat or a handicapped toilet?: No  TIMED UP AND GO:  Was the test performed?  No  Cognitive Function: 6CIT completed        03/08/2024    9:40 AM 03/03/2023   10:34 AM  6CIT Screen  What Year? 0 points 0 points  What month? 0 points 0 points  What time? 0 points 0 points  Count back from 20 0 points 0 points  Months in reverse 0 points 0 points  Repeat phrase 0 points 0 points  Total Score 0 points 0 points    Immunizations Immunization History  Administered Date(s) Administered   Fluad Trivalent(High Dose 65+) 03/29/2023  Influenza-Unspecified 02/26/2016, 03/10/2017, 02/13/2020   MODERNA COVID-19 SARS-COV-2 PEDS BIVALENT BOOSTER 50yr-44yr 07/12/2019, 08/10/2019, 03/30/2020   Moderna Covid-19 Fall Seasonal Vaccine 58yrs & older 09/26/2020, 03/29/2023   PNEUMOCOCCAL CONJUGATE-20 04/18/2023    Unspecified SARS-COV-2 Vaccination 03/11/2021    Screening Tests Health Maintenance  Topic Date Due   DTaP/Tdap/Td (1 - Tdap) Never done   Fecal DNA (Cologuard)  Never done   Zoster Vaccines- Shingrix (1 of 2) Never done   DEXA SCAN  Never done   Influenza Vaccine  01/05/2024   COVID-19 Vaccine (7 - 2024-25 season) 02/05/2024   Medicare Annual Wellness (AWV)  03/08/2025   Mammogram  02/21/2026   Pneumococcal Vaccine: 50+ Years  Completed   Hepatitis C Screening  Completed   HPV VACCINES  Aged Out   Meningococcal B Vaccine  Aged Out    Health Maintenance Items Addressed: Vaccines Due: Flu, Shingrix, and TDap  Declines dexa and colon screening at this time   Additional Screening:  Vision Screening: Recommended annual ophthalmology exams for early detection of glaucoma and other disorders of the eye. Is the patient up to date with their annual eye exam?  No  Who is the provider or what is the name of the office in which the patient attends annual eye exams? none  Dental Screening: Recommended annual dental exams for proper oral hygiene  Community Resource Referral / Chronic Care Management: CRR required this visit?  No   CCM required this visit?  No   Plan:    I have personally reviewed and noted the following in the patient's chart:   Medical and social history Use of alcohol, tobacco or illicit drugs  Current medications and supplements including opioid prescriptions. Patient is not currently taking opioid prescriptions. Functional ability and status Nutritional status Physical activity Advanced directives List of other physicians Hospitalizations, surgeries, and ER visits in previous 12 months Vitals Screenings to include cognitive, depression, and falls Referrals and appointments  In addition, I have reviewed and discussed with patient certain preventive protocols, quality metrics, and best practice recommendations. A written personalized care plan for  preventive services as well as general preventive health recommendations were provided to patient.   Lavelle Pfeiffer [place], [place]   [id number]/[id number]   After Visit Summary: (Declined) Due to this being a telephonic visit, with patients personalized plan was offered to patient but patient Declined AVS at this time   Notes: Nothing significant to report at this time.

## 2024-04-11 DIAGNOSIS — Z85828 Personal history of other malignant neoplasm of skin: Secondary | ICD-10-CM | POA: Diagnosis not present

## 2024-04-11 DIAGNOSIS — L820 Inflamed seborrheic keratosis: Secondary | ICD-10-CM | POA: Diagnosis not present

## 2024-04-11 DIAGNOSIS — B078 Other viral warts: Secondary | ICD-10-CM | POA: Diagnosis not present

## 2024-04-11 DIAGNOSIS — Z08 Encounter for follow-up examination after completed treatment for malignant neoplasm: Secondary | ICD-10-CM | POA: Diagnosis not present

## 2024-05-24 ENCOUNTER — Other Ambulatory Visit: Payer: Self-pay | Admitting: Obstetrics & Gynecology

## 2025-03-14 ENCOUNTER — Ambulatory Visit
